# Patient Record
Sex: Female | Born: 2000 | Hispanic: Yes | Marital: Single | Smoking: Never smoker
Health system: Southern US, Community
[De-identification: ages and names within clinical notes are randomized; demographics above are authoritative.]

## PROBLEM LIST (undated history)

## (undated) HISTORY — PX: NO PAST SURGERIES: SHX2092

---
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
## 2022-01-15 DIAGNOSIS — Z419 Encounter for procedure for purposes other than remedying health state, unspecified: Secondary | ICD-10-CM | POA: Diagnosis not present

## 2022-02-15 DIAGNOSIS — Z419 Encounter for procedure for purposes other than remedying health state, unspecified: Secondary | ICD-10-CM | POA: Diagnosis not present

## 2022-03-17 DIAGNOSIS — Z419 Encounter for procedure for purposes other than remedying health state, unspecified: Secondary | ICD-10-CM | POA: Diagnosis not present

## 2022-04-17 DIAGNOSIS — Z419 Encounter for procedure for purposes other than remedying health state, unspecified: Secondary | ICD-10-CM | POA: Diagnosis not present

## 2022-05-18 DIAGNOSIS — Z419 Encounter for procedure for purposes other than remedying health state, unspecified: Secondary | ICD-10-CM | POA: Diagnosis not present

## 2022-06-17 DIAGNOSIS — Z419 Encounter for procedure for purposes other than remedying health state, unspecified: Secondary | ICD-10-CM | POA: Diagnosis not present

## 2022-07-18 DIAGNOSIS — Z419 Encounter for procedure for purposes other than remedying health state, unspecified: Secondary | ICD-10-CM | POA: Diagnosis not present

## 2022-08-17 DIAGNOSIS — Z419 Encounter for procedure for purposes other than remedying health state, unspecified: Secondary | ICD-10-CM | POA: Diagnosis not present

## 2022-09-17 DIAGNOSIS — Z419 Encounter for procedure for purposes other than remedying health state, unspecified: Secondary | ICD-10-CM | POA: Diagnosis not present

## 2022-09-17 NOTE — L&D Delivery Note (Signed)
OB/GYN Faculty Practice Delivery Note  Kathryn Luna is a 22 y.o. G2P1011 s/p VD at [redacted]w[redacted]d. She was admitted for SOL.   ROM: 0h 37m with clear fluid GBS Status:  Positive/-- (05/31 1021) Maximum Maternal Temperature: 98.35F  Labor Progress: Initial SVE: 7/90/-2. She then progressed to complete.   Delivery Date/Time: 03/22/23  2324 Delivery: Called to room and patient was complete and pushing. Head delivered direct OA. No nuchal cord present. Shoulder and body delivered in usual fashion. Infant with spontaneous cry, placed on mother's abdomen, dried and stimulated. Cord clamped x 2 after 1-minute delay, and cut by FOB. Cord blood drawn. Placenta delivered spontaneously with gentle cord traction. Fundus firm with massage and Pitocin, and methergine. Labia, perineum, vagina, and cervix inspected with 1st degree laceration, repaired in usual fashion with 3-0 monocryl.  Baby Weight: pending  Placenta: 3 vessel, intact. Sent to L&D Complications: None Lacerations: as above EBL: 435 mL Analgesia: Epidural   Infant:  APGAR (1 MIN): 9   APGAR (5 MINS): 9    Myrtie Hawk, DO OB Family Medicine Fellow, [facility] for [facility], [facility] 03/22/2023, 11:49 PM

## 2022-10-08 ENCOUNTER — Ambulatory Visit: Payer: Medicaid Other | Admitting: Student

## 2022-10-09 ENCOUNTER — Ambulatory Visit: Payer: Medicaid Other | Admitting: Student

## 2022-10-16 ENCOUNTER — Ambulatory Visit (INDEPENDENT_AMBULATORY_CARE_PROVIDER_SITE_OTHER): Payer: Medicaid Other | Admitting: Family Medicine

## 2022-10-16 VITALS — BP 98/58 | HR 81 | Wt 148.6 lb

## 2022-10-16 DIAGNOSIS — R011 Cardiac murmur, unspecified: Secondary | ICD-10-CM | POA: Insufficient documentation

## 2022-10-16 DIAGNOSIS — R55 Syncope and collapse: Secondary | ICD-10-CM

## 2022-10-16 HISTORY — DX: Syncope and collapse: R55

## 2022-10-16 HISTORY — DX: Cardiac murmur, unspecified: R01.1

## 2022-10-16 NOTE — Assessment & Plan Note (Addendum)
Recurring problem for several months. Suspect multifactorial etiology including vasovagal response and symptomatic hypoglycemia from skipping breakfast. BP also on low side. Less likely iron deficiency, thyroid abnormality, cardiac arrhythmia. Will check CBC, ferritin, BMP, TSH today. Will also refer to cardiology. May need event monitor and/or echo given frequency of events and murmur heard on exam today (not documented previously).

## 2022-10-16 NOTE — Assessment & Plan Note (Signed)
Heard on exam today. Not documented previously. Suspect benign flow murmur but will check basic labs and refer to cardiology as above.

## 2022-10-16 NOTE — Patient Instructions (Addendum)
It was great to see you!  We will check some basic labs today. If you sign up for MyChart I will send you a message with the results. Otherwise I will send a letter or call if abnormal.  Given the frequency of these episodes and murmur noted on exam today, I have referred you to cardiology. They will call you for an appointment.  Your symptoms may be related to skipping breakfast. When your sugar is low it can cause shakiness, sweating, nausea, dizziness, and passing out. PLEASE eat breakfast every day even if it's just something small. You should also drink more water!  You can read more about "vasovagal episodes" online if you're interested. These are common, not usually dangerous, and sometimes occur after standing for prolonged periods. Try not to lock your knees.  Take care, Dr Rock Nephew   Near-Syncope Near-syncope is when you suddenly feel like you might pass out or faint. This may also be called presyncope. During an episode of near-syncope, you may: Feel dizzy, weak, or light-headed. It may feel like the room is spinning. Feel like you may vomit (nauseous). See spots or see all white or all black. Have cold, clammy skin. Feel warm and sweaty. Hear ringing in your ears. This condition is caused by a sudden decrease in blood flow to the brain. This can result from many causes, but most of those causes are not dangerous. However, near-syncope may be a sign of a serious medical problem, so it is important to seek medical care. Follow these instructions at home: Medicines Take over-the-counter and prescription medicines only as told by your doctor. If you are taking blood pressure or heart medicine, get up slowly and spend many minutes getting ready to sit and then stand. This can help with dizziness. Lifestyle Do not drive, use machinery, or play sports until your doctor says it is okay. Do not drink alcohol. Do not smoke or use any products that contain nicotine or tobacco. If you need  help quitting, ask your doctor. Avoid hot tubs and saunas. General instructions Be aware of any changes in your symptoms. Talk with your doctor about your symptoms. You may need to have testing to help find the cause. If you start to feel like you might pass out, sit or lie down right away. If sitting, lower your head down between your legs. If lying down, raise (elevate) your feet above the level of your heart. Breathe deeply and steadily. Wait until all of the symptoms are gone. Have someone stay with you until you feel better. Drink enough fluid to keep your pee (urine) pale yellow. Avoid standing for a long time. If you must stand for a long time, do movements such as: Moving your legs. Crossing your legs. Flexing and stretching your leg muscles. Squatting. Keep all follow-up visits. Contact a doctor if: You continue to have episodes of near fainting. Get help right away if: You pass out or faint. You have any of these symptoms: Fast or uneven heartbeats (palpitations). Pain in your chest, belly, or back. Shortness of breath. You have a seizure. You have a very bad headache. You are confused. You have trouble seeing. You are very weak. You have trouble walking. You are bleeding from your mouth or butt. You have black or tarry poop (stool). These symptoms may be an emergency. Get help right away. Call your local emergency services (911 in the [place] Do not wait to see if the symptoms will go away. Do not drive yourself  to the hospital. Summary Near-syncope is when you suddenly feel like you might pass out or faint. This condition is caused by a sudden decrease in blood flow to the brain. Near-syncope may be a sign of a serious medical problem, so it is important to seek medical care. If you start to feel like you might pass out, sit or lie down right away. If sitting, lower your head down between your legs. If lying down, raise (elevate) your feet above the level of your  heart. Talk with your doctor about your symptoms. You may need to have testing to help find the cause. This information is not intended to replace advice given to you by your health care provider. Make sure you discuss any questions you have with your health care provider. Document Revised: 01/12/2021 Document Reviewed: 01/12/2021 Elsevier Patient Education  [place].

## 2022-10-16 NOTE — Progress Notes (Signed)
    SUBJECTIVE:   CHIEF COMPLAINT / HPI:   Pre-syncope -occurs ~3-4 times per month, not on consecutive days -was seen for the same back in 2022 -can tell when it's going to happen -states her lips get cold, then her face gets really hot, vision goes dark -sits down, resolves after 30 seconds or a minute -seems to occur more when she doesn't eat breakfast and when she's standing for long periods -skips breakfast frequently -no chest pain or palpitations -not on any medications -LMP 10/13/22, lasts ~3 days, not heavy -drinks 3 bottles of water per day -marijuana twice weekly, no correlation with her symptoms   PERTINENT  PMH / PSH: none  OBJECTIVE:   BP (!) 98/58   Pulse 81   Wt 148 lb 9.6 oz (67.4 kg)   LMP 10/13/2022   SpO2 99%   BMI 28.08 kg/m   Gen: NAD, pleasant, able to participate in exam HEENT: [place]/AT, PERRLA, nares patent bilaterally, TM normal bilaterally, oropharynx unremarkable Neck: supple, no cervical or supraclavicular lymphadenopathy, thyroid smooth and normal in size CV: RRR, normal F1/Q1, II/VI systolic ejection murmur heard best at RUSB Resp: Normal effort, lungs CTAB GI: abdomen soft, non-tender, non-distended Extremities: no edema or cyanosis Skin: warm and dry, no rashes noted Neuro: alert, CN II-XII intact, 5/5 strength in all extremities, sensation intact throughout, normal gait Psych: Normal affect and mood  ASSESSMENT/PLAN:   Near syncope Recurring problem for several months. Suspect multifactorial etiology including vasovagal response and symptomatic hypoglycemia from skipping breakfast. BP also on low side. Less likely iron deficiency, thyroid abnormality, cardiac arrhythmia. Will check CBC, ferritin, BMP, TSH today. Will also refer to cardiology. May need event monitor and/or echo given frequency of events and murmur heard on exam today (not documented previously).  Murmur Heard on exam today. Not documented previously. Suspect benign flow  murmur but will check basic labs and refer to cardiology as above.     Alcus Dad, MD [place]

## 2022-10-17 LAB — BASIC METABOLIC PANEL
BUN/Creatinine Ratio: 13 (ref 9–23)
BUN: 5 mg/dL — ABNORMAL LOW (ref 6–20)
CO2: 18 mmol/L — ABNORMAL LOW (ref 20–29)
Calcium: 8.8 mg/dL (ref 8.7–10.2)
Chloride: 102 mmol/L (ref 96–106)
Creatinine, Ser: 0.39 mg/dL — ABNORMAL LOW (ref 0.57–1.00)
Glucose: 81 mg/dL (ref 70–99)
Potassium: 4.1 mmol/L (ref 3.5–5.2)
Sodium: 136 mmol/L (ref 134–144)
eGFR: 147 mL/min/{1.73_m2} (ref >59)

## 2022-10-17 LAB — CBC
Hematocrit: 31.5 % — ABNORMAL LOW (ref 34.0–46.6)
Hemoglobin: 11 g/dL — ABNORMAL LOW (ref 11.1–15.9)
MCH: 31 pg (ref 26.6–33.0)
MCHC: 34.9 g/dL (ref 31.5–35.7)
MCV: 89 fL (ref 79–97)
Platelets: 239 10*3/uL (ref 150–450)
RBC: 3.55 x10E6/uL — ABNORMAL LOW (ref 3.77–5.28)
RDW: 13.2 % (ref 11.7–15.4)
WBC: 5.9 10*3/uL (ref 3.4–10.8)

## 2022-10-17 LAB — FERRITIN: Ferritin: 14 ng/mL — ABNORMAL LOW (ref 15–77)

## 2022-10-17 LAB — TSH: TSH: 0.502 u[IU]/mL (ref 0.450–4.500)

## 2022-10-18 DIAGNOSIS — Z419 Encounter for procedure for purposes other than remedying health state, unspecified: Secondary | ICD-10-CM | POA: Diagnosis not present

## 2022-10-22 ENCOUNTER — Encounter: Payer: Self-pay | Admitting: Family Medicine

## 2022-10-22 ENCOUNTER — Telehealth: Payer: Self-pay | Admitting: Family Medicine

## 2022-10-22 MED ORDER — FERROUS SULFATE 325 (65 FE) MG PO TABS: 325.0000 mg | ORAL_TABLET | ORAL | 0 refills | Status: DC

## 2022-10-22 NOTE — Telephone Encounter (Signed)
Attempted to reach patient again to discuss labs. No answer. Left HIPAA compliant voicemail.  If she returns call, please inform her that her iron levels are low. I recommend she take PO iron supplement every other day. Rx has been sent to her pharmacy.   Alcus Dad, MD PGY-3, [place]

## 2022-10-22 NOTE — Addendum Note (Signed)
Addended by: Alcus Dad on: 10/22/2022 07:36 PM   Modules accepted: Orders

## 2022-10-22 NOTE — Telephone Encounter (Signed)
Attempted to call patient to discuss results from recent visit. Labs show mild anemia and low ferritin consistent with iron deficiency. No answer.  Will try to reach her again this evening.   Alcus Dad, MD PGY-3, Kathryn Luna

## 2022-10-23 NOTE — Telephone Encounter (Signed)
Patient returns call to nurse.   Results discussed with patient and the need to start an iron supplement.   Patient advised this was sent to her pharmacy already. We discussed directions.  All questions answered.

## 2022-11-16 DIAGNOSIS — Z419 Encounter for procedure for purposes other than remedying health state, unspecified: Secondary | ICD-10-CM | POA: Diagnosis not present

## 2022-12-10 ENCOUNTER — Encounter: Payer: Self-pay | Admitting: Family Medicine

## 2022-12-10 ENCOUNTER — Ambulatory Visit (INDEPENDENT_AMBULATORY_CARE_PROVIDER_SITE_OTHER): Payer: Medicaid Other | Admitting: Family Medicine

## 2022-12-10 ENCOUNTER — Other Ambulatory Visit: Payer: Self-pay | Admitting: Family Medicine

## 2022-12-10 VITALS — BP 110/66 | HR 74 | Ht 62.0 in | Wt 153.0 lb

## 2022-12-10 DIAGNOSIS — Z3201 Encounter for pregnancy test, result positive: Secondary | ICD-10-CM | POA: Diagnosis not present

## 2022-12-10 LAB — POCT URINE PREGNANCY: Preg Test, Ur: POSITIVE — AB

## 2022-12-10 NOTE — Patient Instructions (Addendum)
It was nice seeing you today!  Please go ahead and start a prenatal vitamin.  We will be giving you a call about your first prenatal appointment.  Stay well, Kathryn Button, MD [place] [phone]  --  Make sure to check out at the front desk before you leave today.  Please arrive at least 15 minutes prior to your scheduled appointments.  If you had blood work today, I will send you a MyChart message or a letter if results are normal. Otherwise, I will give you a call.  If you had a referral placed, they will call you to set up an appointment. Please give [place] a call if you don't hear back in the next 2 weeks.  If you need additional refills before your next appointment, please call your pharmacy first.

## 2022-12-10 NOTE — Progress Notes (Signed)
    SUBJECTIVE:   CHIEF COMPLAINT / HPI:  Chief Complaint  Patient presents with   Possible Pregnancy         Patient here for possible pregnancy.  Home pregnancy test  possible 1 week ago. Pregnancy was not planned.  She has been in shock but planning to keep pregnancy.  Would like prenatal care at [facility].  Her partner is also excited. She is worried because she has not told her mother about pregnancy yet.  Planning to tell her sometime this week.  PERTINENT  PMH / PSH:   Patient Care Team: Ezequiel Essex, MD as PCP - General (Family Medicine)   OBJECTIVE:   BP 110/66   Pulse 74   Ht 5\' 2"  (1.575 m)   Wt 153 lb (69.4 kg)   LMP  (LMP Unknown)   Breastfeeding No   BMI 27.98 kg/m   Physical Exam Constitutional:      General: She is not in acute distress. Cardiovascular:     Rate and Rhythm: Normal rate.  Pulmonary:     Effort: Pulmonary effort is normal. No respiratory distress.  Musculoskeletal:     Cervical back: Neck supple.  Neurological:     Mental Status: She is alert.         12/10/2022    9:39 AM  Depression screen PHQ 2/9  Decreased Interest 0  Down, Depressed, Hopeless 0  PHQ - 2 Score 0  Altered sleeping 0  Tired, decreased energy 0  Change in appetite 0  Feeling bad or failure about yourself  0  Trouble concentrating 0  Moving slowly or fidgety/restless 0  Suicidal thoughts 0  PHQ-9 Score 0     {Show previous vital signs (optional):23777}    ASSESSMENT/PLAN:   1. Pregnancy test positive Urine pregnancy test in office confirms home positive pregnancy test.  Obtain prenatal labs today. - POCT urine pregnancy - CBC/D/Plt+RPR+Rh+ABO+RubIgG... - advised to start prenatal vitamin - message sent to admin staff to schedule initial prenatal visit    Zola Button, MD [place]

## 2022-12-12 LAB — CBC/D/PLT+RPR+RH+ABO+RUBIGG...
Antibody Screen: NEGATIVE
Basophils Absolute: 0 10*3/uL (ref 0.0–0.2)
Basos: 1 %
Bilirubin, UA: NEGATIVE
EOS (ABSOLUTE): 0.1 10*3/uL (ref 0.0–0.4)
Eos: 1 %
Glucose, UA: NEGATIVE
HCV Ab: NONREACTIVE
HIV Screen 4th Generation wRfx: NONREACTIVE
Hematocrit: 32.2 % — ABNORMAL LOW (ref 34.0–46.6)
Hemoglobin: 10.6 g/dL — ABNORMAL LOW (ref 11.1–15.9)
Hepatitis B Surface Ag: NEGATIVE
Immature Grans (Abs): 0 10*3/uL (ref 0.0–0.1)
Immature Granulocytes: 0 %
Lymphocytes Absolute: 0.9 10*3/uL (ref 0.7–3.1)
Lymphs: 13 %
MCH: 30.5 pg (ref 26.6–33.0)
MCHC: 32.9 g/dL (ref 31.5–35.7)
MCV: 93 fL (ref 79–97)
Monocytes Absolute: 0.4 10*3/uL (ref 0.1–0.9)
Monocytes: 6 %
Neutrophils Absolute: 5.1 10*3/uL (ref 1.4–7.0)
Neutrophils: 79 %
Nitrite, UA: NEGATIVE
Platelets: 268 10*3/uL (ref 150–450)
RBC, UA: NEGATIVE
RBC: 3.48 x10E6/uL — ABNORMAL LOW (ref 3.77–5.28)
RDW: 13.5 % (ref 11.7–15.4)
RPR Ser Ql: NONREACTIVE
Rh Factor: POSITIVE
Rubella Antibodies, IGG: 1.54 index (ref >0.99)
Specific Gravity, UA: 1.017 (ref 1.005–1.030)
Urobilinogen, Ur: 1 mg/dL (ref 0.2–1.0)
WBC: 6.4 10*3/uL (ref 3.4–10.8)
pH, UA: 7 (ref 5.0–7.5)

## 2022-12-12 LAB — HCV INTERPRETATION

## 2022-12-12 LAB — MICROSCOPIC EXAMINATION
Casts: NONE SEEN /lpf
Epithelial Cells (non renal): >10 /hpf — AB (ref 0–10)
RBC, Urine: NONE SEEN /hpf (ref 0–2)

## 2022-12-12 LAB — URINE CULTURE, OB REFLEX

## 2022-12-17 ENCOUNTER — Ambulatory Visit: Payer: Medicaid Other | Admitting: Family Medicine

## 2022-12-17 DIAGNOSIS — Z419 Encounter for procedure for purposes other than remedying health state, unspecified: Secondary | ICD-10-CM | POA: Diagnosis not present

## 2022-12-21 ENCOUNTER — Ambulatory Visit (INDEPENDENT_AMBULATORY_CARE_PROVIDER_SITE_OTHER): Payer: Medicaid Other | Admitting: Family Medicine

## 2022-12-21 ENCOUNTER — Other Ambulatory Visit (HOSPITAL_COMMUNITY)
Admission: RE | Admit: 2022-12-21 | Discharge: 2022-12-21 | Disposition: A | Discharge status: Home / Self Care | Payer: Medicaid Other | Source: Ambulatory Visit | Attending: Family Medicine | Admitting: Family Medicine

## 2022-12-21 VITALS — BP 106/60 | HR 86 | Wt 158.4 lb

## 2022-12-21 DIAGNOSIS — Z3490 Encounter for supervision of normal pregnancy, unspecified, unspecified trimester: Secondary | ICD-10-CM | POA: Insufficient documentation

## 2022-12-21 DIAGNOSIS — Z789 Other specified health status: Secondary | ICD-10-CM

## 2022-12-21 MED ORDER — FERROUS SULFATE 325 (65 FE) MG PO TABS: 325.0000 mg | ORAL_TABLET | ORAL | 3 refills | Status: DC

## 2022-12-21 MED ORDER — PRENATAL 27-0.8 MG PO TABS: 1.0000 | ORAL_TABLET | Freq: Every day | ORAL | 3 refills | Status: DC

## 2022-12-21 NOTE — Patient Instructions (Signed)
It was wonderful to see you today. Thank you for allowing me to be a part of your care. Below is a short summary of what we discussed at your visit today:  Pregnancy Dating ultrasound is next week. We will see how far along you are. See next page for appointment details.   Start prenatal vitamin EVERY day.  Keep taking iron every other day.  After 12 weeks of pregnacy, you can start a baby aspirin (81 mg) every day to help reduce chances of pre-eclampsia and eclampsia.   Blood work today to check you don't have a hemoglobin disorder like sickle cell disease or trait.   Once we know you are more than [redacted] weeks along, we can get the genetic testing for both yourself (Horizon test) and baby Runner, broadcasting/film/video test). These are both covered by insurance.   You will do an early diabetes screening test at your convenience. Let's have you do it after your ultrasound so we know how far along you are.   Prenatal Classes Go to OnSiteLending.nl for more information on the pregnancy and child birth classes that [facility] has to offer.   Resource regarding exposures that could affect pregnancy: Mother To Pecola Leisure is a Dentist with lots of information on the effects of many medications and exposures on your pregnancy. It is free to use, including their web site, phone line, text service, app, or email and live chat.  http://golden-thomas.org/ Email or live chat: MotherToBaby.org Phone: (340)313-2584 Text: 3476365474 App: search "LactRx"   Emergency Planning If you experience any vaginal bleeding, leakage of fluids, don't feel your baby moving as much, or start to have contractions less than 5 minutes apart please go directly to the Maternal Assessment Unit at [facility] for evaluation.  Maternity and women's care services located on the [place] side of The [place]. [facility] (Entrance C off [address]  [address] [place],  [place]  [address]     Please bring all of your medications to every appointment!  If you have any questions or concerns, please do not hesitate to contact us via phone or MyChart message.   Fayette Pho, MD

## 2022-12-21 NOTE — Progress Notes (Signed)
Patient Name: Kathryn Luna Date of Birth: [birth year] [name] Initial Prenatal Visit  Kathryn Luna is a 22 y.o. year old G2P0010 at Unknown GA who presents for her initial prenatal visit. Pregnancy is not planned She reports fatigue and positive home pregnancy test positive 2-3 weeks ago.  She is not taking a prenatal vitamin.  Is taking iron pills for known anemia. She denies pelvic pain or vaginal bleeding.   Pregnancy Dating: The patient is dated by unknown.  LMP: unknown, possibly January Normal periods with spotting  Period is certain:  No.  Periods were regular:  No.  LMP was a typical period:  n/a Using hormonal contraception in 3 months prior to conception: No  Lab Review: Blood type: A Rh Status: + Antibody screen: Negative HIV: Negative RPR: Negative Hemoglobin electrophoresis reviewed: No - not performed. Will collect today.  Results of OB urine culture are: Negative Rubella: Immune Hep C Ab: Negative Varicella status is Unknown - vaccination status not in [facility], although patient grew up here in [place] and is young enough to have varicella vaccine as part of routine childhood vaccines.   PMH: Reviewed and as detailed below: HTN: No  Gestational Hypertension/preeclampsia: No  Type 1 or 2 Diabetes: No  Depression:  No  Seizure disorder:  No VTE: No ,  History of STI: Yes chlamydia previously 08/31/2021 Abnormal Pap smear:  No Genital herpes simplex:  No   PSH: Gynecologic Surgery:  no Surgical history reviewed, notable for: None  Obstetric History: Obstetric history tab updated and reviewed.  Summary of prior pregnancies: 1 Elective abortion 2023 Cesarean delivery: No  Gestational Diabetes:  No Hypertension in pregnancy: No History of preterm birth: No History of LGA/SGA infant:  No History of shoulder dystocia: No Indications for referral were reviewed, and the patient has no obstetric indications for referral to  High Risk OB Clinic at this time.   Social History: Partner's name: Ephriam Knuckles  Tobacco use: Yes - up until one month ago (marijuana) but none since positive home pregnancy test Alcohol use:  No Other substance use:  No  Current Medications:  Iron supplement - every other day  Reviewed and appropriate in pregnancy.   Genetic and Infection Screen: Flow Sheet Updated Yes  Prenatal Exam: Gen: Well nourished, well developed.  No distress.  Vitals noted. HEENT: Normocephalic, atraumatic.   CV: RRR no murmur, gallops or rubs Lungs: CTA B.  Normal respiratory effort without wheezes or rales. Abd: soft, NTND. +BS.  Uterus not appreciated above pelvis. GU: Normal external female genitalia without lesions.  Nl vaginal, well rugated without lesions. No vaginal discharge.  Bimanual exam: No adnexal mass or TTP. No CMT.  Uterus size appears to be 2 inches above the umbilicus - precepted with Dr. Deirdre Priest, who agreed abdomen appears to have mass above umbilicus. Not as firm as expected for gravid uterus.  Natal: FHR detected easily above pubic symphysis, 140s Ext: No clubbing, cyanosis or edema. Psych: Normal grooming and dress.  Not depressed or anxious appearing.  Normal thought content and process without flight of ideas or looseness of associations  Fetal heart tones: Appropriate - 140s  Assessment/Plan:  Kathryn Luna is a 22 y.o. G2P0010 at Unknown who presents to initiate prenatal care. She is doing well.   Current pregnancy issues include history of chlamydia, unclear if appropriately treated.  Routine prenatal care: As dating is not reliable, a dating ultrasound has been ordered. Dating tab updated. Pre-pregnancy weight updated. Expected weight gain  this pregnancy is 25-35 pounds  Prenatal labs reviewed, notable for anemia. Indications for referral to HROB were reviewed and the patient does not meet criteria for referral.  Medication list reviewed and updated.   Recommended patient see a dentist for regular care.  Bleeding and pain precautions reviewed. Importance of prenatal vitamins reviewed.  Genetic screening offered. Patient opted for: Panorama and Horizon screenings The patient has the following indications for aspirinto begin 81 mg at 12-16 weeks: One high risk condition: no single high risk condition  MORE than one moderate risk condition: nulliparity and low SES   Aspirin was  recommended today based upon above risk factors (one high risk condition or more than one moderate risk factor)  The patient will not be age 20 or over at time of delivery. Referral to genetic counseling was not offered today.  The patient has the following risk factors for preexisting diabetes: BMI > 25 and high risk ethnicity (Latino, PhilippinesAfrican American, Native American, MalawiPacific Islander, Asian Naval architectAmerican) . An early 1 hour glucose tolerance test was ordered. Pregnancy Medical Home and PHQ-9 forms completed, problems noted: No  2. Pregnancy issues include the following which were addressed today:   Prenatal labs: Majority of new OB labs collected previously. Just missing the hemoglobin electrophoresis. Will draw today.  Unclear gestational age and LMP: abdominal mass palpable up to 2 inches above umbilicus, albeit softer than expected for gravid uterus. Patient denies current constipation. Fetal heart tones heard on Doppler, so at least ~[redacted] weeks along.  Dating US scheduled for next week Genetic testing desired: Panorama and Horizon Will schedule once we know patient is > [redacted] weeks gestation Unknown varicella and Hep B: [place]. Patient is young enough and has only lived in [place], so should have been vaccinated.  Varicella titers at next blood draw Early diabetes screen: Ordered early 1 hr GTT for screening. Patient to obtain lab only appointment. STI screen: History of (+) chlamydia screen 08/31/2021, chart notes unclear as to whether she completed PO treatment.   Collected routine vaginal swab and PAP smear today. Will follow results and treat as indicated.  Start prenatal vitamins Continue iron supplement every other day Complete pregnancy medical home form and PHQ-9 at next appointment.   Next f/u 1 month - although if dating US shows later gestational age, will call to re-adjust.   Fayette Phoatherine Paxtyn Wisdom, MD

## 2022-12-24 LAB — CYTOLOGY - PAP
Chlamydia: NEGATIVE
Comment: NEGATIVE
Comment: NEGATIVE
Comment: NORMAL
Diagnosis: NEGATIVE
Neisseria Gonorrhea: NEGATIVE
Trichomonas: NEGATIVE

## 2022-12-26 ENCOUNTER — Ambulatory Visit (HOSPITAL_COMMUNITY)
Admission: RE | Admit: 2022-12-26 | Discharge: 2022-12-26 | Disposition: A | Discharge status: Home / Self Care | Payer: Medicaid Other | Source: Ambulatory Visit | Attending: Family Medicine | Admitting: Family Medicine

## 2022-12-26 ENCOUNTER — Other Ambulatory Visit: Payer: Self-pay | Admitting: Family Medicine

## 2022-12-26 DIAGNOSIS — Z3A29 29 weeks gestation of pregnancy: Secondary | ICD-10-CM | POA: Diagnosis not present

## 2022-12-26 DIAGNOSIS — Z789 Other specified health status: Secondary | ICD-10-CM

## 2022-12-26 DIAGNOSIS — Z3490 Encounter for supervision of normal pregnancy, unspecified, unspecified trimester: Secondary | ICD-10-CM | POA: Diagnosis not present

## 2022-12-26 DIAGNOSIS — Z3689 Encounter for other specified antenatal screening: Secondary | ICD-10-CM | POA: Diagnosis not present

## 2022-12-26 LAB — HGB FRACTIONATION CASCADE
Hgb A2: 2.3 % (ref 1.8–3.2)
Hgb A: 97.7 % (ref 96.4–98.8)
Hgb F: 0 % (ref 0.0–2.0)
Hgb S: 0 %

## 2022-12-28 ENCOUNTER — Telehealth: Payer: Self-pay

## 2022-12-28 DIAGNOSIS — Z3490 Encounter for supervision of normal pregnancy, unspecified, unspecified trimester: Secondary | ICD-10-CM | POA: Insufficient documentation

## 2022-12-28 DIAGNOSIS — Z3A29 29 weeks gestation of pregnancy: Secondary | ICD-10-CM

## 2022-12-28 MED ORDER — ASPIRIN 81 MG PO TBEC
81.00 mg | DELAYED_RELEASE_TABLET | Freq: Every day | ORAL | 1 refills | Status: DC
Start: 2022-12-28 — End: 2023-03-24

## 2022-12-28 NOTE — Telephone Encounter (Signed)
Called patient twice, no answer, left brief voicemail.  Next called mother to verify her phone number, mom reports she is at work currently and has her phone on silent.  It sounds like mom was there at the ultrasound with the patient and asks about results, however I did not disclose this as I do not have written consent by patient at this time.  Called patient again, now that I have double checked her phone number with her mother, left a more detailed voicemail that included how far along she was, her due date, and that she is to call back for appointments.  As of dating ultrasound on 4/10, was 29 weeks 2 days gestation. Her due date is March 11, 2023.   I have booked her the following appointments to keep her on schedule: - 4/26 at 10:30 am, with myself - 5/09 at 10:30 am with [facility] faculty clinic - 5/24 at 1:30 pm, with myself  Action items:  If schedule conflict with above appointments, should be rescheduled as close as possible Needs lab only appointment ASAP for 1 hour glucose tolerance test, varicella titers, and genetic screening with Horizon and panorama Anatomy ultrasound - I have CCed [facility] team to schedule this and call patient with date and time Patient to start baby aspirin, prescription sent to pharmacy  Fayette Pho, MD

## 2022-12-28 NOTE — Telephone Encounter (Signed)
Patient calls nurse line requesting results of recent US.  She reports the tech would not share how far a long she is.   Will forward to PCP.

## 2022-12-31 ENCOUNTER — Telehealth: Payer: Self-pay | Admitting: Family Medicine

## 2022-12-31 ENCOUNTER — Telehealth: Payer: Self-pay

## 2022-12-31 NOTE — Telephone Encounter (Signed)
Called patient to discuss results. She got the voicemail from Friday with her gestational age and due date. All questions answered. Patient already scheduled for lab only appointment tomorrow, 4/16. Gave her the next prenatal appointment day and time.   MFM OB US for anatomy not yet scheduled. Told patient will be done soon and RN will call her with details.   Fayette Pho, MD

## 2022-12-31 NOTE — Telephone Encounter (Signed)
Called patient with upcoming appointments and there was no answer.  Left voicemail for patient to call office to receive information.  01/11/2023 @ 1030 [facility] 01/24/2023 @ 1030 [facility] 01/31/2023 Ultrasound @ 1345[name] Luna M[facility] 02/08/2023 @ 1330 [facility]  .Glennie Hawk, CMA

## 2023-01-01 ENCOUNTER — Other Ambulatory Visit: Payer: Medicaid Other

## 2023-01-01 DIAGNOSIS — Z789 Other specified health status: Secondary | ICD-10-CM

## 2023-01-01 DIAGNOSIS — Z3490 Encounter for supervision of normal pregnancy, unspecified, unspecified trimester: Secondary | ICD-10-CM

## 2023-01-01 DIAGNOSIS — Z3A29 29 weeks gestation of pregnancy: Secondary | ICD-10-CM | POA: Diagnosis not present

## 2023-01-01 DIAGNOSIS — Z3493 Encounter for supervision of normal pregnancy, unspecified, third trimester: Secondary | ICD-10-CM | POA: Diagnosis not present

## 2023-01-02 LAB — VARICELLA ZOSTER ABS, IGG/IGM
Varicella IgM: <0.91 index (ref 0.00–0.90)
Varicella zoster IgG: 321 index (ref >165)

## 2023-01-02 LAB — GLUCOSE TOLERANCE, 1 HOUR: Glucose, 1Hr PP: 78 mg/dL (ref 70–199)

## 2023-01-07 ENCOUNTER — Encounter: Payer: Self-pay | Admitting: Family Medicine

## 2023-01-07 DIAGNOSIS — O0930 Supervision of pregnancy with insufficient antenatal care, unspecified trimester: Secondary | ICD-10-CM | POA: Insufficient documentation

## 2023-01-08 ENCOUNTER — Telehealth: Payer: Self-pay

## 2023-01-08 NOTE — Telephone Encounter (Signed)
Patient calls nurse line regarding genetic test results. She states that she was told that results would come in within a week.   She tried to use a QR code, however, this is not working. She also tried to reach out directly to lab. She reports that she was told she would not be able to get results until 5/18.  Forwarding to PCP.   Veronda Prude, RN

## 2023-01-09 ENCOUNTER — Telehealth: Payer: Self-pay | Admitting: Family Medicine

## 2023-01-09 DIAGNOSIS — Z3A31 31 weeks gestation of pregnancy: Secondary | ICD-10-CM

## 2023-01-09 DIAGNOSIS — Z30017 Encounter for initial prescription of implantable subdermal contraceptive: Secondary | ICD-10-CM

## 2023-01-09 DIAGNOSIS — Z1371 Encounter for nonprocreative screening for genetic disease carrier status: Secondary | ICD-10-CM

## 2023-01-09 HISTORY — DX: Encounter for initial prescription of implantable subdermal contraceptive: Z30.017

## 2023-01-09 HISTORY — DX: Encounter for nonprocreative screening for genetic disease carrier status: Z13.71

## 2023-01-09 LAB — HORIZON 4 (SMA, CF, FRAGILE X, DMD)
CYSTIC FIBROSIS: NEGATIVE
DUCHENNE/BECKER MUSCULAR DYSTROPHY: NEGATIVE
FRAGILE X SYNDROME: NEGATIVE
REPORT SUMMARY: NEGATIVE
SPINAL MUSCULAR ATROPHY: NEGATIVE

## 2023-01-09 NOTE — Telephone Encounter (Signed)
Received results from Horizon screening for mom's genetic carrier status. No answer, left VM. Horizon test was negative for all 4 genetic diseases screened: cystic fibrosis, spinal muscular atrophy, Duchenne Muscular Dystrophy, and Fragile X syndrome.   Will call back once we have the Panorama results for baby.   Fayette Pho, MD

## 2023-01-09 NOTE — Assessment & Plan Note (Signed)
Called mom with results. Left VM. See phone note.

## 2023-01-10 LAB — PANORAMA PRENATAL TEST FULL PANEL:PANORAMA TEST PLUS 5 ADDITIONAL MICRODELETIONS: FETAL FRACTION: 12.7

## 2023-01-11 ENCOUNTER — Ambulatory Visit (INDEPENDENT_AMBULATORY_CARE_PROVIDER_SITE_OTHER): Payer: Medicaid Other | Admitting: Family Medicine

## 2023-01-11 ENCOUNTER — Encounter: Payer: Self-pay | Admitting: Family Medicine

## 2023-01-11 VITALS — BP 110/62 | HR 84 | Wt 161.0 lb

## 2023-01-11 DIAGNOSIS — Z23 Encounter for immunization: Secondary | ICD-10-CM

## 2023-01-11 DIAGNOSIS — Z3A31 31 weeks gestation of pregnancy: Secondary | ICD-10-CM | POA: Diagnosis not present

## 2023-01-11 DIAGNOSIS — Z789 Other specified health status: Secondary | ICD-10-CM | POA: Diagnosis not present

## 2023-01-11 NOTE — Patient Instructions (Signed)
It was wonderful to see you today. Thank you for allowing me to be a part of your care. Below is a short summary of what we discussed at your visit today:  Pregnancy TDaP vaccine today to protect the baby from pertussis.  We are currently out of the COVID booster. You can get it at your next appointment or at any pharmacy.   RSV vaccine: For pregnant ladies, this vaccine is called RSVpreF Verdis Frederickson).  We do not have it here in clinic.  I would call the health department to see if you can get it there.   Prenatal Classes Go to OnSiteLending.nl for more information on the pregnancy and child birth classes that [facility] has to offer.   Resource regarding exposures that could affect pregnancy: Mother To Pecola Leisure is a Dentist with lots of information on the effects of many medications and exposures on your pregnancy. It is free to use, including their web site, phone line, text service, app, or email and live chat.  http://golden-thomas.org/ Email or live chat: MotherToBaby.org Phone: (404)326-4994 Text: 480-242-4806 App: search "LactRx"   Emergency Planning If you experience any vaginal bleeding, leakage of fluids, don't feel your baby moving as much, or start to have contractions less than 5 minutes apart please go directly to the Maternal Assessment Unit at [facility] for evaluation.  Maternity and women's care services located on the [place] side of The [place]. [facility] (Entrance C off [address]  [address] [place],  [place]  [address]     Please bring all of your medications to every appointment!  If you have any questions or concerns, please do not hesitate to contact us via phone or MyChart message.   Fayette Pho, MD

## 2023-01-11 NOTE — Progress Notes (Signed)
  [facility] Prenatal Visit  Ieesha Abbasi is a 22 y.o. G2P0010 at [redacted]w[redacted]d here for routine follow up. She is dated by midtrimester ultrasound.  She reports no complaints.  She reports fetal movement. She denies vaginal bleeding, contractions, or loss of fluid.  See flow sheet for details.  Vitals:   01/11/23 1045  BP: 110/62  Pulse: 84   A/P: Pregnancy at [redacted]w[redacted]d.  Doing well.   Routine prenatal care:  Dating reviewed, dating tab is correct Fetal heart tones: Appropriate 150s Fundal height: within expected range.  30-31 cm The patient does not have a history of HSV and valacyclovir is not indicated at this time.  The patient does not have a history of Cesarean delivery and no referral to [facility] is indicated Infant feeding choice: Both  Contraception choice: Nexplanon  Infant circumcision desired no if boy - gender is surprise to mom Influenza vaccine not administered as not influenza season.   Tdap was given today. COVID vaccination was discussed, patient amenable, not available today.   Childbirth and education classes were offered. See AVS.  Preterm labor and fetal movement precautions reviewed.  2. Pregnancy issues include the following and were addressed as appropriate today: Problem list and pregnancy box updated: Yes.  Anatomy US: Scheduled for 01/31/23 Unknown Hep B: Not in [facility]. Patient is young enough and has only lived in [place], so should have been vaccinated. Will obtain Hep. B titers today.  Continue prenatal vitamins Continue iron supplement every other day Complete pregnancy medical home form and PHQ-9 at next appointment.   Scheduled for Ob Faculty clinic in third trimester on 5/09.   Follow up 2 weeks.   Fayette Pho, MD

## 2023-01-11 NOTE — Assessment & Plan Note (Signed)
TDaP today. Recommended COVID booster and RSV vaccine at health department or pharmacy.

## 2023-01-12 LAB — CBC
Hematocrit: 30.3 % — ABNORMAL LOW (ref 34.0–46.6)
Hemoglobin: 10.1 g/dL — ABNORMAL LOW (ref 11.1–15.9)
MCH: 30.8 pg (ref 26.6–33.0)
MCHC: 33.3 g/dL (ref 31.5–35.7)
MCV: 92 fL (ref 79–97)
Platelets: 261 10*3/uL (ref 150–450)
RBC: 3.28 x10E6/uL — ABNORMAL LOW (ref 3.77–5.28)
RDW: 13.7 % (ref 11.7–15.4)
WBC: 6.5 10*3/uL (ref 3.4–10.8)

## 2023-01-12 LAB — HAV, HBV IMMUNITY
Hep B Core Total Ab: NEGATIVE
Hep B Surface Ab, Qual: NONREACTIVE
hep A Total Ab: POSITIVE — AB

## 2023-01-12 LAB — HAVIGM: Hep A IgM: NEGATIVE

## 2023-01-15 ENCOUNTER — Encounter: Payer: Self-pay | Admitting: Family Medicine

## 2023-01-15 DIAGNOSIS — Z0184 Encounter for antibody response examination: Secondary | ICD-10-CM | POA: Insufficient documentation

## 2023-01-15 DIAGNOSIS — Z789 Other specified health status: Secondary | ICD-10-CM | POA: Insufficient documentation

## 2023-01-16 DIAGNOSIS — Z419 Encounter for procedure for purposes other than remedying health state, unspecified: Secondary | ICD-10-CM | POA: Diagnosis not present

## 2023-01-21 ENCOUNTER — Encounter: Payer: Medicaid Other | Admitting: Family Medicine

## 2023-01-22 ENCOUNTER — Encounter: Payer: Self-pay | Admitting: Family Medicine

## 2023-01-24 ENCOUNTER — Other Ambulatory Visit: Payer: Self-pay

## 2023-01-24 ENCOUNTER — Ambulatory Visit (INDEPENDENT_AMBULATORY_CARE_PROVIDER_SITE_OTHER): Payer: Medicaid Other | Admitting: Family Medicine

## 2023-01-24 VITALS — BP 116/65 | HR 93 | Wt 161.4 lb

## 2023-01-24 DIAGNOSIS — Z3490 Encounter for supervision of normal pregnancy, unspecified, unspecified trimester: Secondary | ICD-10-CM | POA: Diagnosis not present

## 2023-01-24 DIAGNOSIS — Z23 Encounter for immunization: Secondary | ICD-10-CM

## 2023-01-24 MED ORDER — FERROUS SULFATE 325 (65 FE) MG PO TABS: 325.0000 mg | ORAL_TABLET | ORAL | 3 refills | Status: DC

## 2023-01-24 NOTE — Patient Instructions (Addendum)
Go to the MAU at [facility] at [facility] if: You have cramping/contractions that do not go away with drinking water Your water breaks.  Sometimes it is a big gush of fluid, sometimes it is just a trickle that keeps getting your underwear wet or running down your legs You have vaginal bleeding.    You do not feel your baby moving like normal.  If you do not, get something to eat and drink and lay down and focus on feeling your baby move. If your baby is still not moving like normal, you should go to MAU.  Please call the ultrasound department at [phone] to reschedule your ultrasound appointment. Please get the ultrasound ASAP.  Be well, Dr. Pollie Meyer

## 2023-01-24 NOTE — Progress Notes (Signed)
  [facility] Prenatal Visit  Kathryn Luna is a 22 y.o. G2P0010 at [redacted]w[redacted]d here for routine follow up. She is dated by midtrimester ultrasound.  She reports no complaints.  She reports fetal movement. She denies vaginal bleeding, contractions, or loss of fluid.  See flow sheet for details.  Vitals:   01/24/23 1037  BP: 116/65  Pulse: 93     A/P: Pregnancy at [redacted]w[redacted]d.  Doing well.   Routine prenatal care:  Dating reviewed, dating tab is correct Fetal heart tones: Appropriate Fundal height: within expected range.  The patient does not have a history of HSV and valacyclovir is not indicated at this time.  The patient does not have a history of Cesarean delivery and no referral to [facility] is indicated Infant feeding choice: Both  Contraception choice: Nexplanon  Infant circumcision desired yes **does not know sex Influenza vaccine not administered as not influenza season.   Tdap was not given today, previously administered COVID vaccination was discussed and received booster today.  Childbirth and education classes were offered. Pregnancy education regarding benefits of breastfeeding, contraception, fetal growth, expected weight gain, and safe infant sleep were discussed.  Preterm labor and fetal movement precautions reviewed.   2. Pregnancy issues include the following and were addressed as appropriate today:  Unclear dating - pt initially scheduled for MFM on 5/16 but needs to reschedule due to travel, provided patient with number to call and reschedule for as soon as possible  Problem list and pregnancy box updated: Yes.    Follow up 2 weeks.

## 2023-01-31 ENCOUNTER — Ambulatory Visit: Payer: Medicaid Other

## 2023-02-08 ENCOUNTER — Encounter: Payer: Self-pay | Admitting: Family Medicine

## 2023-02-15 ENCOUNTER — Ambulatory Visit (INDEPENDENT_AMBULATORY_CARE_PROVIDER_SITE_OTHER): Payer: Medicaid Other | Admitting: Family Medicine

## 2023-02-15 ENCOUNTER — Other Ambulatory Visit (HOSPITAL_COMMUNITY)
Admission: RE | Admit: 2023-02-15 | Discharge: 2023-02-15 | Disposition: A | Discharge status: Home / Self Care | Payer: Medicaid Other | Source: Ambulatory Visit | Attending: Family Medicine | Admitting: Family Medicine

## 2023-02-15 ENCOUNTER — Telehealth: Payer: Self-pay

## 2023-02-15 ENCOUNTER — Encounter: Payer: Self-pay | Admitting: *Deleted

## 2023-02-15 VITALS — BP 104/62 | HR 81 | Wt 166.2 lb

## 2023-02-15 DIAGNOSIS — Z3493 Encounter for supervision of normal pregnancy, unspecified, third trimester: Secondary | ICD-10-CM | POA: Diagnosis not present

## 2023-02-15 DIAGNOSIS — Z3483 Encounter for supervision of other normal pregnancy, third trimester: Secondary | ICD-10-CM | POA: Insufficient documentation

## 2023-02-15 DIAGNOSIS — Z3A36 36 weeks gestation of pregnancy: Secondary | ICD-10-CM | POA: Diagnosis not present

## 2023-02-15 DIAGNOSIS — O365930 Maternal care for other known or suspected poor fetal growth, third trimester, not applicable or unspecified: Secondary | ICD-10-CM

## 2023-02-15 HISTORY — DX: Maternal care for other known or suspected poor fetal growth, third trimester, not applicable or unspecified: O36.5930

## 2023-02-15 NOTE — Progress Notes (Cosign Needed Addendum)
  [facility] Prenatal Visit  Kathryn Luna is a 22 y.o. G2P0010 at [redacted]w[redacted]d here for routine follow up. She is dated by midtrimester ultrasound.  She reports vaginal irritation/inguinal pain. She reports fetal movement. She denies vaginal bleeding, contractions, or loss of fluid. See flow sheet for details.  Vitals:   02/15/23 0833  BP: 104/62  Pulse: 81   Inguinal pain: Some inguinal and labia majora pain Intermittent  Discussed etiology of round ligament pain Infant still moving well, no vag DC or bleeding, no contractions/cramps  A/P: Pregnancy at [redacted]w[redacted]d.  Doing well.   Routine prenatal care  Dating reviewed, dating tab is correct Fetal heart tones Appropriate - 150s Fundal height >2 cm from expected size given dating, discussed with preceptor.  - 33 cm Fetal position confirmed Vertex using Ultrasound .  GBS collected today. .  Repeat GC/CT collected today.  The patient does not have a history of HSV and valacyclovir is not indicated at this time.  Infant feeding choice: Both  Contraception choice: Nexplanon  Infant circumcision desired yes (if female) Influenza vaccine not administered as not influenza season.   Tdap previously administered between 27-36 weeks  @ 01/11/23 COVID booster given 01/24/23 Pregnancy education regarding preterm labor, fetal movement,  benefits of breastfeeding, contraception, fetal growth, expected weight gain, and safe infant sleep were discussed.   2. Pregnancy issues include the following and were addressed as appropriate today:  Routine prenatal care: Continue aspirin, PNV, iron. Tolerating all well.  Size-date discrepancy: Given teen G2P0010 mother, high risk for IUGR. No anatomy scan yet. Will call MFM to attempt to move up [place] sooner.  Late to prenatal care: Initiated care around 7 months pregnant, somewhat cryptic early pregnancy. Adequate follow up and testing since.  Hep B non-immune: Prenatal titers show non-immune.  [facility] indicates 3 doses completed as infant. Plan to complete after delivery.  Problem list and pregnancy box updated: Yes.   Follow up 1 week.    Fayette Pho, MD

## 2023-02-15 NOTE — Patient Instructions (Addendum)
It was wonderful to see you today. Thank you for allowing me to be a part of your care. Below is a short summary of what we discussed at your visit today:  Pregnancy You are getting close! You are 36 weeks and 4 days today.  Due date is estimated as 03/11/23.  Keep taking your prenatal vitamin, iron pill, and aspirin. You're doing a good job! Baby is head down - meaning "locked and loaded" for delivery. :)  As of right now, here are the plans you've told us about: Epidural with delivery for pain control Nexplanon placed in your arm in the hospital before you go home Breast-feeding and bottlefeeding  Ultrasound TODAY at 1:30 pm.  Details about address on the next page.  Arrive at 1:15 pm to get all checked in.   Prenatal Classes Go to OnSiteLending.nl for more information on the pregnancy and child birth classes that  has to offer.   Emergency Planning If you experience any vaginal bleeding, leakage of fluids, don't feel your baby moving as much, or start to have contractions less than 5 minutes apart please go directly to the Maternal Assessment Unit at [facility] for evaluation.  Maternity and women's care services located on the [place] side of The [place]. [facility] (Entrance C off [address]  [address] Entrance C [place],  [place]  [address]     If you have any questions or concerns, please do not hesitate to contact us via phone or MyChart message.   Fayette Pho, MD Due d

## 2023-02-16 DIAGNOSIS — Z419 Encounter for procedure for purposes other than remedying health state, unspecified: Secondary | ICD-10-CM | POA: Diagnosis not present

## 2023-02-18 ENCOUNTER — Encounter: Payer: Self-pay | Admitting: *Deleted

## 2023-02-18 ENCOUNTER — Telehealth: Payer: Self-pay | Admitting: Family Medicine

## 2023-02-18 ENCOUNTER — Ambulatory Visit: Payer: Medicaid Other | Attending: Family Medicine

## 2023-02-18 ENCOUNTER — Ambulatory Visit: Payer: Medicaid Other | Admitting: *Deleted

## 2023-02-18 ENCOUNTER — Encounter: Payer: Self-pay | Admitting: Family Medicine

## 2023-02-18 VITALS — BP 113/55 | HR 67

## 2023-02-18 DIAGNOSIS — O365930 Maternal care for other known or suspected poor fetal growth, third trimester, not applicable or unspecified: Secondary | ICD-10-CM | POA: Diagnosis not present

## 2023-02-18 DIAGNOSIS — Z3A36 36 weeks gestation of pregnancy: Secondary | ICD-10-CM

## 2023-02-18 LAB — CERVICOVAGINAL ANCILLARY ONLY
Chlamydia: NEGATIVE
Comment: NEGATIVE
Comment: NEGATIVE
Comment: NORMAL
Neisseria Gonorrhea: NEGATIVE
Trichomonas: NEGATIVE

## 2023-02-18 LAB — CULTURE, BETA STREP (GROUP B ONLY): Strep Gp B Culture: POSITIVE — AB

## 2023-02-18 NOTE — Telephone Encounter (Signed)
Called to discuss results. No answer, left VM.   GBS (+) - No penicillin allergy. Will need abx at delivery.   Anatomy US - normal anatomy, limited by gestational age. EDC updated, now 6/30.   Next OB appt 6/07 with myself.   Fayette Pho, MD

## 2023-02-21 NOTE — Progress Notes (Unsigned)
  [facility] Prenatal Visit  Kathryn Luna is a 22 y.o. G2P0010 at [redacted]w[redacted]d here for routine follow up. She is dated by late anatomy US.  She reports {symptoms; pregnancy related:14538}. She reports fetal movement. She denies vaginal bleeding, contractions, or loss of fluid. See flow sheet for details.  There were no vitals filed for this visit.  A/P: Pregnancy at [redacted]w[redacted]d.  Doing well.   Routine prenatal care  Dating reviewed, dating tab is correct - now based off late anatomy US on 02/18/23 Fetal heart tones {appropriate:23337} 148 Fundal height {fundal height:23342::"within expected range. "} 33 Fetal position confirmed Vertex last appointment using ultrasound and again demonstrated on anatomy US 02/18/23.  GBS collected previously and positive. No known PCN allergy.  Repeat GC/CT collected previously and negative.  The patient does not have a history of HSV and valacyclovir is not indicated at this time.  Infant feeding choice: Both  Contraception choice: Nexplanon  Infant circumcision desired yes Influenza vaccine not administered as not influenza season.   Tdap previously administered between 27-36 weeks  @ 01/11/23 COVID booster given 01/24/23 Pregnancy education regarding preterm labor, fetal movement,  benefits of breastfeeding, contraception, fetal growth, expected weight gain, and safe infant sleep were discussed.    2. Pregnancy issues include the following and were addressed as appropriate today:  Problem list and pregnancy box updated: {yes/no:20286::"Yes"}.   Routine prenatal care: Continue aspirin, PNV, iron. Tolerating all well.  GBS (+): Discussed with patient. Plan for antibiotics at time of delivery. No known allergies.  Size-date discrepancy: Resolved now that [facility] corrected by anatomy US 6/03. *** Late to prenatal care: Initiated care around 7 months pregnant, somewhat cryptic early pregnancy. Adequate follow up and testing since.  Hep B  non-immune: Prenatal titers show non-immune. NCIR indicates 3 doses completed as infant. Plan to complete after delivery.   Follow up 1 week.    Fayette Pho, MD

## 2023-02-21 NOTE — Patient Instructions (Addendum)
Prenatal care You are entering the home stretch!  Baby can come at any time now. Have a bag of clothes ready to go.  Keep taking the iron, prenatal vitamin, and aspirin.  You'll see [place] every week until delivery.   Hepatitis B You'll need to get the vaccinations again, since you are non-immune.  This will be a series of 3 shots at 0, 1, and 6 months.  We can start these any time.   Prenatal Classes Go to [email or web address] for more information on the pregnancy and child birth classes that [place] has to offer.   Emergency Planning and start of labor -  If you experience any vaginal bleeding, leakage of fluids, don't feel your baby moving as much, or start to have contractions less than 5 minutes apart please go directly to the Maternal Assessment Unit at [facility] for evaluation.  Maternity and women's care services located on the [place] side of [facility] [place]. [facility] (Entrance C off [address]  [address] [place],  [place]  [address]     Primary care doctor graduating soon! I am graduating June 28th. You will be assigned a new PCP.  Thank you for letting me care for you! It has been wonderful getting to know you.  I will be moving go the [facility] in [place] on [address]. While I won't have a primary care clinic, you can always stop by and say hi!  If you have any questions or concerns, please do not hesitate to contact us via phone or MyChart message.   Fayette Pho, MD

## 2023-02-22 ENCOUNTER — Ambulatory Visit (INDEPENDENT_AMBULATORY_CARE_PROVIDER_SITE_OTHER): Payer: Medicaid Other | Admitting: Family Medicine

## 2023-02-22 VITALS — BP 108/60 | HR 92 | Wt 165.0 lb

## 2023-02-22 DIAGNOSIS — Z3A36 36 weeks gestation of pregnancy: Secondary | ICD-10-CM

## 2023-02-28 ENCOUNTER — Other Ambulatory Visit: Payer: Medicaid Other

## 2023-02-28 ENCOUNTER — Ambulatory Visit: Payer: Medicaid Other

## 2023-02-28 NOTE — Patient Instructions (Addendum)
It was wonderful to see you today. Thank you for allowing me to be a part of your care. Below is a short summary of what we discussed at your visit today:  Prenatal  Keep taking the aspirin, prenatal vitamin, and iron supplement. Prenatal visits every week until delivery! Your due date is estimated as June 30. Your GBS swab was positive for group B strep bacteria.  This is a normal vaginal bacteria for many women.  This means that you will simply get antibiotics at the time of delivery.  IF YOU HAVE ANOTHER NEAR-FAINTING EPISODE - Go to the maternal assessment unit (MAU) at [facility].    Prenatal Classes Go to [email or web address] for more information on the pregnancy and child birth classes that [place] has to offer.   Labor time! If you experience any vaginal bleeding, leakage of fluids, don't feel your baby moving as much, or start to have contractions less than 5 minutes apart please go directly to the Maternal Assessment Unit at [facility] for evaluation.  Maternity and women's care services located on the [place] side of [facility] [place]. [facility] (Entrance C off [address]  [address] Entrance C [place],  [place]  [address]      If you have any questions or concerns, please do not hesitate to contact us via phone or MyChart message.   Fayette Pho, MD

## 2023-03-01 ENCOUNTER — Ambulatory Visit (INDEPENDENT_AMBULATORY_CARE_PROVIDER_SITE_OTHER): Payer: Medicaid Other | Admitting: Family Medicine

## 2023-03-01 VITALS — BP 96/64 | HR 84 | Temp 98.2°F | Wt 168.4 lb

## 2023-03-01 DIAGNOSIS — Z3A37 37 weeks gestation of pregnancy: Secondary | ICD-10-CM
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
## 2023-03-01 NOTE — Progress Notes (Signed)
Follow up.  Fainting episode on Sunday 02/24/2023, weakness

## 2023-03-08 ENCOUNTER — Telehealth: Payer: Self-pay

## 2023-03-08 ENCOUNTER — Ambulatory Visit (INDEPENDENT_AMBULATORY_CARE_PROVIDER_SITE_OTHER): Payer: Medicaid Other | Admitting: Family Medicine

## 2023-03-08 VITALS — BP 100/50 | HR 87 | Wt 170.4 lb

## 2023-03-08 DIAGNOSIS — Z3A38 38 weeks gestation of pregnancy: Secondary | ICD-10-CM

## 2023-03-08 NOTE — Patient Instructions (Signed)
It was wonderful to see you today. Thank you for allowing me to be a part of your care. Below is a short summary of what we discussed at your visit today:  Pregnancy Care You are in the home stretch! Keep taking your prenatal vitamin, aspirin, and iron supplement every day. We talked about thus far as you are planning on breast-feeding bottlefeeding, Nexplanon for birth control after delivery, and an epidural for pain control.  You can always change this in the hospital, but this close the team have a heads up in case you come in and they need to act quickly. Today I will call over to get your BPP (special ultrasound) and induction scheduled for after your due date We will let you know the dates via MyChart  LABOR TIME! Woot woot! If you experience any vaginal bleeding, leakage of fluids, don't feel your baby moving as much, or start to have contractions less than 5 minutes apart please go directly to the Maternal Assessment Unit at [facility] for evaluation.  Maternity and women's care services located on the [place] side of [facility] [place]. [facility] (Entrance C off [address]).  [address] Entrance C [place],  [place]  [address]     Maternal Mental Health If you start to develop the below symptoms of depression, please reach out to [place] for an appointment. There is also a Biomedical scientist Health Hotline at ([phone] [phone]). This hotline has trained counselors, doulas, and midwifes to real-time support, information, and resources.  Feeling sad or hopeless most of the time Lack of interest in things you used to enjoy Less interest in caring for yourself (dressing, fixing hair) Trouble concentrating Trouble coping with daily tasks Constant worry about your baby Sleeping or eating too much or too little Feeling very anxious or nervous Unexplained irritability or anger Unwanted or scary thoughts Feeling that you are not a good  mother Thoughts of hurting yourself or your baby  If you feel you are experiencing a mental health crisis, please reach out to the National Suicide Prevention Hotline at 1-800-273-TALK 570-624-4638) or go directly to the [facility], open 24/7.  [phone]) [phone] [address], [place], [place] [address]   If you have any questions or concerns, please do not hesitate to contact us via phone or MyChart message.   Fayette Pho, MD

## 2023-03-08 NOTE — Progress Notes (Signed)
  [facility] Prenatal Visit  Kathryn Luna is a 22 y.o. G2P0010 at [redacted]w[redacted]d here for routine follow up. She is dated by midtrimester ultrasound.  She reports no complaints. She reports fetal movement. She denies vaginal bleeding, contractions, or loss of fluid. See flow sheet for details.  Vitals:   03/08/23 0837  BP: (!) 100/50  Pulse: 87   A/P: Pregnancy at [redacted]w[redacted]d.  Doing well.   Routine prenatal care:  Dating reviewed, dating tab is correct Fetal heart tones Appropriate 140 Fundal height within expected range.  36-37 Fetal position confirmed Vertex using Ultrasound .  Infant feeding choice: Both  Contraception choice: Nexplanon  Infant circumcision desired Undecided Pain control in labor discussed and patient desires epidural.  Influenza vaccine not administered as not influenza season.   Tdap previously administered between 27-36 weeks  @ 01/11/23 COVID booster given 01/24/23 GBS collected previously and positive. No known PCN allergy.  Repeat GC/CT collected previously and negative.  Pregnancy education regarding labor, fetal movement,  benefits of breastfeeding, contraception, and safe infant sleep were discussed.  Labor and fetal movement precautions reviewed. Induction of labor discussed. Scheduled for induction at approximately 41 weeks. BPP scheduled between 40-41 weeks.   2. Pregnancy issues include the following and were addressed as appropriate today: Problem list and pregnancy box updated: Yes.  Routine prenatal care: Continue aspirin, PNV, iron. Tolerating all well.  Pre-syncopal episode: Has not occurred since last exam.  GBS (+): Discussed with patient. Plan for antibiotics at time of delivery. No known allergies.  Size-date discrepancy: Improved now that [facility] corrected by anatomy US to 6/30, one week later than previously thought. EFW 55th %ile on anatomy US 6/03, suspect retroverted uterus contributing.  Late to prenatal care: Initiated care  around 7 months pregnant, somewhat cryptic early pregnancy. Adequate follow up and testing since.  Hep B non-immune: Prenatal titers show non-immune. NCIR indicates 3 doses completed as infant. Plan to complete after delivery.   Follow up 1 week.    Fayette Pho, MD  Fayette Pho, MD

## 2023-03-08 NOTE — Telephone Encounter (Signed)
Called patient to provide appointment information for BPP.   She did not answer, LVM asking that patient return call to office.   BPP is scheduled for 03/19/23 at 12:30 at [facility]- [address], [place] on the second floor.   Please advise patient of appointment information when she calls back.   Veronda Prude, RN

## 2023-03-12 ENCOUNTER — Telehealth (HOSPITAL_COMMUNITY): Payer: Self-pay | Admitting: *Deleted

## 2023-03-12 ENCOUNTER — Ambulatory Visit (INDEPENDENT_AMBULATORY_CARE_PROVIDER_SITE_OTHER): Payer: Medicaid Other | Admitting: Family Medicine

## 2023-03-12 VITALS — BP 98/60 | HR 86 | Temp 98.6°F | Wt 172.4 lb

## 2023-03-12 DIAGNOSIS — Z3A39 39 weeks gestation of pregnancy: Secondary | ICD-10-CM

## 2023-03-12 NOTE — Progress Notes (Signed)
  [facility] Prenatal Visit  Kathryn Luna is a 22 y.o. G2P0010 at [redacted]w[redacted]d here for routine follow up. She is dated by {Ob dating:14516}.  She reports {symptoms; pregnancy related:14538}. She reports fetal movement. She denies vaginal bleeding, contractions, or loss of fluid. See flow sheet for details.  Vitals:   03/12/23 1005  BP: 98/60  Pulse: 86  Temp: 98.6 F (37 C)   A/P: Pregnancy at [redacted]w[redacted]d.  Doing well.   Routine prenatal care:  Dating reviewed, dating tab is correct Fetal heart tones {appropriate:23337} Fundal height {fundal height:23342::"within expected range. "} Fetal position confirmed Vertex using Ultrasound  at last OB appointment ([redacted]w[redacted]d) Infant feeding choice: Both  Contraception choice: Nexplanon  Infant circumcision desired Undecided Pain control in labor discussed and patient desires epidural.  Influenza vaccine not administered as not influenza season.   Tdap previously administered between 27-36 weeks  @ 01/11/23 COVID booster given 01/24/23 GBS collected previously and positive. No known PCN allergy.  Repeat GC/CT collected previously and negative. Pregnancy education regarding labor, fetal movement,  benefits of breastfeeding, contraception, and safe infant sleep were discussed.  Labor and fetal movement precautions reviewed. Induction of labor discussed. Scheduled for induction at approximately 41 weeks. BPP scheduled between 40-41 weeks.   2. Pregnancy issues include the following and were addressed as appropriate today:  Problem list and pregnancy box updated: {yes/no:20286::"Yes"}.  Routine prenatal care: Continue aspirin, PNV, iron. Tolerating all well.  GBS (+): Discussed with patient. Plan for antibiotics at time of delivery. No known allergies.  Size-date discrepancy: Improved now that [facility] corrected by anatomy US to 6/30, one week later than previously thought. EFW 55th %ile on anatomy US 6/03, suspect retroverted uterus  contributing.  Late to prenatal care: Initiated care around 7 months pregnant, somewhat cryptic early pregnancy. Adequate follow up and testing since.  Hep B non-immune: Prenatal titers show non-immune. NCIR indicates 3 doses completed as infant. Plan to complete after delivery.   Follow up 1 week.    Fayette Pho, MD

## 2023-03-12 NOTE — Patient Instructions (Signed)
BABY TIME If you experience any vaginal bleeding, leakage of fluids, don't feel your baby moving as much, or start to have contractions less than 5 minutes apart please go directly to the Maternal Assessment Unit at [facility] for evaluation.  Maternity and women's care services located on the [place] side of [facility] [place]. [facility] (Entrance C off [address]).  [address] Entrance [address] [place],  [place]  [address]     Planning for going past 40 weeks of pregnancy: 7/2 (at 40 weeks 2 days of pregnancy): BPP ultrasound.   This is the ultrasound to make sure baby's heart rate, fluid, and growth all look good.  This is very common to do if woman goes over 40 weeks of pregnancy. Arrive by 12:20 pm [facility]   7/07 (at 41 weeks of pregnancy): Induction at the labor and delivery floor at [facility] are scheduled for a "daytime" induction This means you should expect a call from the labor and delivery floor sometime during the day letting you know when to come in Have a bag packed and ready to go!  Maternal Mental Health If you start to develop the below symptoms of depression, please reach out to [place] for an appointment. There is also a Biomedical scientist Health Hotline at [phone]). This hotline has trained counselors, doulas, and midwifes to real-time support, information, and resources.  Feeling sad or hopeless most of the time Lack of interest in things you used to enjoy Less interest in caring for yourself (dressing, fixing hair) Trouble concentrating Trouble coping with daily tasks Constant worry about your baby Sleeping or eating too much or too little Feeling very anxious or nervous Unexplained irritability or anger Unwanted or scary thoughts Feeling that you are not a good mother Thoughts of hurting yourself or your baby  If you feel you are experiencing a mental health crisis,  please reach out to the National Suicide Prevention Hotline at 1-800-273-TALK 709-095-9794) or go directly to the [facility], open 24/7.  [phone] [address] [place], [place] [address]   Please bring all of your medications to every appointment!  If you have any questions or concerns, please do not hesitate to contact us via phone or MyChart message.   Fayette Pho, MD

## 2023-03-12 NOTE — Telephone Encounter (Signed)
Preadmission screen  

## 2023-03-14 ENCOUNTER — Telehealth (HOSPITAL_COMMUNITY): Payer: Self-pay | Admitting: *Deleted

## 2023-03-14 ENCOUNTER — Encounter: Payer: Self-pay | Admitting: *Deleted

## 2023-03-14 NOTE — Telephone Encounter (Signed)
Preadmission screen  

## 2023-03-15 ENCOUNTER — Encounter: Payer: Medicaid Other | Admitting: Family Medicine

## 2023-03-15 ENCOUNTER — Encounter (HOSPITAL_COMMUNITY): Payer: Self-pay | Admitting: *Deleted

## 2023-03-15 ENCOUNTER — Telehealth (HOSPITAL_COMMUNITY): Payer: Self-pay | Admitting: *Deleted

## 2023-03-15 NOTE — Progress Notes (Signed)
  [facility] Prenatal Visit  Kathryn Luna is a 22 y.o. G2P0010 at [redacted]w[redacted]d here for routine follow up. She is dated by midtrimester ultrasound.  She reports {symptoms; pregnancy related:14538}. She reports fetal movement. She denies vaginal bleeding, contractions, or loss of fluid.  See flow sheet for details.  There were no vitals filed for this visit.   A/P: Pregnancy at [redacted]w[redacted]d.  Doing well.   Routine prenatal care:  Dating reviewed, dating tab is {correct:23336::"correct"} Fetal heart tones {appropriate:23337} Fundal height {fundal height:23342::"within expected range. "} Fetal position confirmed: confirmed Vertex using Ultrasound at ([redacted]w[redacted]d)  Infant feeding choice: Both  Contraception choice: Nexplanon  Infant circumcision desired {Response; yes/no/na:63} Pain control in labor discussed and patient desires epidural.  Influenza vaccine {given:23340}  GBS+. No known PCN allergy Tdap previously administered between 27-36 weeks  on 01/11/23 Biophysical profile is scheduled between 40w0 day and [redacted]w[redacted]d on 03/19/23. Induction of labor discussed with patient. Discussed plan with preceptor and Ob on call. Induction scheduled for 41 weeks on 03/24/23.  Labor and fetal movement precautions reviewed.  2. Pregnancy issues include the following and were addressed as appropriate today:   Problem list and pregnancy box updated: {yes/no:20286::"Yes"}.  Routine prenatal care: Continue aspirin, PNV, iron. Tolerating all well.  GBS (+): Discussed with patient. Plan for antibiotics at time of delivery. No known allergies.  Size-date discrepancy: Improved now that [facility] corrected by anatomy US to 6/30, one week later than previously thought. EFW 55th %ile on anatomy US 6/03, suspect retroverted uterus contributing.  Late to prenatal care: Initiated care around 7 months pregnant, somewhat cryptic early pregnancy. Adequate follow up and testing since.  Hep B non-immune: Prenatal titers show  non-immune. NCIR indicates 3 doses completed as infant. Plan to complete after delivery.  Scheduled for 6 week postpartum visit.

## 2023-03-15 NOTE — Telephone Encounter (Signed)
Preadmission screen  

## 2023-03-16 ENCOUNTER — Other Ambulatory Visit: Payer: Self-pay | Admitting: Advanced Practice Midwife

## 2023-03-18 ENCOUNTER — Ambulatory Visit (INDEPENDENT_AMBULATORY_CARE_PROVIDER_SITE_OTHER): Payer: Medicaid Other | Admitting: Family Medicine

## 2023-03-18 VITALS — BP 111/65 | HR 75 | Temp 98.2°F | Wt 173.2 lb

## 2023-03-18 DIAGNOSIS — Z419 Encounter for procedure for purposes other than remedying health state, unspecified: Secondary | ICD-10-CM | POA: Diagnosis not present

## 2023-03-18 DIAGNOSIS — Z3A40 40 weeks gestation of pregnancy: Secondary | ICD-10-CM | POA: Diagnosis not present

## 2023-03-18 DIAGNOSIS — O480 Post-term pregnancy: Secondary | ICD-10-CM | POA: Diagnosis not present

## 2023-03-18 NOTE — Patient Instructions (Signed)
Your induction is scheduled for 7/7. If you go into labor before that time please go to the hospital and be checked out. Contractions are regular if they are every 3-5 minutes and uncomfortable. If you feel a gush of fluid or have any vaginal bleeding, please be seen right away.   Pregnancy Related Return Precautions The follow are signs/symptoms that are abnormal in pregnancy and may require further evaluation by a physician: Go to the MAU at [facility] at [facility] if: You have cramping/contractions that do not go away with drinking water, especially if they are lasting 30 seconds to 1.5 minutes, coming and going every 5-10 minutes for an hour or more, or are getting stronger and you cannot walk or talk while having a contraction/cramp. Your water breaks.  Sometimes it is a big gush of fluid, sometimes it is just a trickle that keeps getting your underwear wet or running down your legs You have vaginal bleeding.    You do not feel your baby moving like normal.  If you do not, get something to eat and drink (something cold or something with sugar like peanut butter or juice) and lay down and focus on feeling your baby move. If your baby is still not moving like normal, you should go to MAU. You should feel your baby move 6 times in one hour, or 10 times in two hours. You have a persistent headache that does not go away with 1 g of Tylenol, vision changes, chest pain, difficulty breathing, severe pain in your right upper abdomen, worsening leg swelling- these can all be signs of high blood pressure in pregnancy and need to be evaluated by a provider immediately  These are all concerning in pregnancy and if you have any of these I recommend you call your PCP and present to the Maternity Admissions Unit (map below) for further evaluation.  For any pregnancy-related emergencies, please go to the Maternity Admissions Unit in the [facility] at [facility]. You will  use hospital Entrance C.    Our clinic number is [phone].   Dr Miquel Dunn

## 2023-03-19 ENCOUNTER — Other Ambulatory Visit: Payer: Self-pay | Admitting: Family Medicine

## 2023-03-19 ENCOUNTER — Ambulatory Visit: Payer: Medicaid Other | Attending: Family Medicine

## 2023-03-19 ENCOUNTER — Ambulatory Visit: Payer: Medicaid Other | Admitting: *Deleted

## 2023-03-19 VITALS — BP 110/63 | HR 72

## 2023-03-19 DIAGNOSIS — Z3A38 38 weeks gestation of pregnancy: Secondary | ICD-10-CM

## 2023-03-19 DIAGNOSIS — O365990 Maternal care for other known or suspected poor fetal growth, unspecified trimester, not applicable or unspecified: Secondary | ICD-10-CM | POA: Insufficient documentation

## 2023-03-19 DIAGNOSIS — O480 Post-term pregnancy: Secondary | ICD-10-CM | POA: Insufficient documentation

## 2023-03-19 DIAGNOSIS — Z3A40 40 weeks gestation of pregnancy: Secondary | ICD-10-CM | POA: Diagnosis not present

## 2023-03-20 ENCOUNTER — Encounter (HOSPITAL_COMMUNITY): Payer: Self-pay | Admitting: Family Medicine

## 2023-03-20 ENCOUNTER — Inpatient Hospital Stay (EMERGENCY_DEPARTMENT_HOSPITAL)
Admission: AD | Admit: 2023-03-20 | Discharge: 2023-03-21 | Disposition: A | Discharge status: Home / Self Care | Payer: Medicaid Other | Source: Home / Self Care | Attending: Family Medicine | Admitting: Family Medicine

## 2023-03-20 ENCOUNTER — Inpatient Hospital Stay (HOSPITAL_COMMUNITY)
Admission: AD | Admit: 2023-03-20 | Discharge: 2023-03-20 | Disposition: A | Discharge status: Home / Self Care | Payer: Medicaid Other | Attending: Family Medicine | Admitting: Family Medicine

## 2023-03-20 DIAGNOSIS — O479 False labor, unspecified: Secondary | ICD-10-CM

## 2023-03-20 DIAGNOSIS — O480 Post-term pregnancy: Secondary | ICD-10-CM | POA: Diagnosis not present

## 2023-03-20 DIAGNOSIS — O471 False labor at or after 37 completed weeks of gestation: Secondary | ICD-10-CM | POA: Insufficient documentation

## 2023-03-20 DIAGNOSIS — Z3A40 40 weeks gestation of pregnancy: Secondary | ICD-10-CM | POA: Insufficient documentation

## 2023-03-20 NOTE — MAU Provider Note (Signed)
Kathryn Luna is a 22 y.o. G2P0010 female at [redacted]w[redacted]d  RN Labor check, not seen by provider SVE by RN: Dilation: 1 Effacement (%): 50 Station: -2 Exam by:: A English as a second language teacher x2 NST: FHR baseline 130 bpm, Variability: moderate, Accelerations:present, Decelerations:  Absent= Cat 1/Reactive Toco: irregular, every 2-7 minutes  D/C home  Cheral Marker CNM, [facility] 03/20/2023 4:31 AM

## 2023-03-20 NOTE — MAU Note (Signed)
.  Kathryn Luna is a 22 y.o. at [redacted]w[redacted]d here in MAU reporting ctxs since 1500 Tues. Ctxs have gotten closer and stronger. Reports good FM and denies LOF or VB. No recent sve  Onset of complaint: 1500 Tues Pain score: 7 Vitals:   03/20/23 0248 03/20/23 0250  BP:  111/62  Pulse: 74   Resp: 17   Temp: 97.7 F (36.5 C)   SpO2: 100%      FHT:137 Lab orders placed from triage:  mau labor eval

## 2023-03-21 DIAGNOSIS — Z3A40 40 weeks gestation of pregnancy: Secondary | ICD-10-CM | POA: Diagnosis not present

## 2023-03-21 DIAGNOSIS — O471 False labor at or after 37 completed weeks of gestation: Secondary | ICD-10-CM | POA: Diagnosis not present

## 2023-03-21 DIAGNOSIS — O480 Post-term pregnancy: Secondary | ICD-10-CM | POA: Diagnosis not present

## 2023-03-21 NOTE — MAU Note (Signed)
.  Kathryn Luna is a 22 y.o. at [redacted]w[redacted]d here in MAU reporting:   Contractions every: 5-8 minutes Onset of ctx: Yesterday Pain score: 10/10  ROM: Intact Vaginal Bleeding: None Last SVE: 1 cm Fetal Movement: Reports positive FM

## 2023-03-21 NOTE — MAU Provider Note (Signed)
Kathryn Luna is a 22 y.o. G72P0010 female at [redacted]w[redacted]d  RN Labor check, not seen by provider SVE by RN: Dilation: 1.5 Effacement (%): 50 Station: -1 Exam by:: A Sherlon Handing RN NST: FHR baseline 130s bpm, Variability: moderate, Accelerations:present, Decelerations:  Absent= Cat 1/Reactive Toco: irregular, every 3-7 minutes  D/C home; for IOL 03/24/23 if no labor  Arabella Merles CNM 03/21/2023 1:48 AM

## 2023-03-22 ENCOUNTER — Inpatient Hospital Stay (HOSPITAL_COMMUNITY): Payer: Medicaid Other | Admitting: Anesthesiology

## 2023-03-22 ENCOUNTER — Other Ambulatory Visit: Payer: Self-pay

## 2023-03-22 ENCOUNTER — Inpatient Hospital Stay (HOSPITAL_COMMUNITY)
Admission: AD | Admit: 2023-03-22 | Discharge: 2023-03-24 | DRG: 807 | Disposition: A | Discharge status: Home / Self Care | Payer: Medicaid Other | Attending: Obstetrics and Gynecology | Admitting: Obstetrics and Gynecology

## 2023-03-22 ENCOUNTER — Encounter (HOSPITAL_COMMUNITY): Payer: Self-pay | Admitting: Obstetrics and Gynecology

## 2023-03-22 DIAGNOSIS — Z3A40 40 weeks gestation of pregnancy: Secondary | ICD-10-CM

## 2023-03-22 DIAGNOSIS — Z3A00 Weeks of gestation of pregnancy not specified: Secondary | ICD-10-CM | POA: Diagnosis not present

## 2023-03-22 DIAGNOSIS — Z30017 Encounter for initial prescription of implantable subdermal contraceptive: Secondary | ICD-10-CM

## 2023-03-22 DIAGNOSIS — O480 Post-term pregnancy: Principal | ICD-10-CM | POA: Diagnosis present

## 2023-03-22 DIAGNOSIS — O99820 Streptococcus B carrier state complicating pregnancy: Secondary | ICD-10-CM | POA: Diagnosis not present

## 2023-03-22 DIAGNOSIS — O99824 Streptococcus B carrier state complicating childbirth: Secondary | ICD-10-CM | POA: Diagnosis present

## 2023-03-22 DIAGNOSIS — Z7982 Long term (current) use of aspirin: Secondary | ICD-10-CM | POA: Diagnosis not present

## 2023-03-22 DIAGNOSIS — Z975 Presence of (intrauterine) contraceptive device: Secondary | ICD-10-CM

## 2023-03-22 LAB — TYPE AND SCREEN
ABO/RH(D): A POS
Antibody Screen: NEGATIVE

## 2023-03-22 LAB — CBC
HCT: 36.6 % (ref 36.0–46.0)
Hemoglobin: 12.3 g/dL (ref 12.0–15.0)
MCH: 30.4 pg (ref 26.0–34.0)
MCHC: 33.6 g/dL (ref 30.0–36.0)
MCV: 90.4 fL (ref 80.0–100.0)
Platelets: 235 10*3/uL (ref 150–400)
RBC: 4.05 MIL/uL (ref 3.87–5.11)
RDW: 14.2 % (ref 11.5–15.5)
WBC: 11.8 10*3/uL — ABNORMAL HIGH (ref 4.0–10.5)
nRBC: 0 % (ref 0.0–0.2)

## 2023-03-22 MED ORDER — OXYCODONE-ACETAMINOPHEN 5-325 MG PO TABS: 2.0000 | ORAL_TABLET | ORAL | Status: DC | PRN

## 2023-03-22 MED ORDER — LACTATED RINGERS IV SOLN: 500.0000 mL | Freq: Once | INTRAVENOUS | Status: DC

## 2023-03-22 MED ORDER — SOD CITRATE-CITRIC ACID 500-334 MG/5ML PO SOLN: 30.0000 mL | ORAL | Status: DC | PRN

## 2023-03-22 MED ORDER — EPHEDRINE 5 MG/ML INJ: 10.0000 mg | INTRAVENOUS | Status: DC | PRN

## 2023-03-22 MED ORDER — LIDOCAINE HCL (PF) 1 % IJ SOLN
INTRAMUSCULAR | Status: DC | PRN
  Administered 2023-03-22: 8 mL via EPIDURAL

## 2023-03-22 MED ORDER — DIPHENHYDRAMINE HCL 50 MG/ML IJ SOLN: 12.5000 mg | INTRAMUSCULAR | Status: DC | PRN

## 2023-03-22 MED ORDER — OXYTOCIN BOLUS FROM INFUSION
333.0000 mL | Freq: Once | INTRAVENOUS | Status: AC
  Administered 2023-03-22: 333 mL via INTRAVENOUS

## 2023-03-22 MED ORDER — PHENYLEPHRINE 80 MCG/ML (10ML) SYRINGE FOR IV PUSH (FOR BLOOD PRESSURE SUPPORT): 80.0000 ug | PREFILLED_SYRINGE | INTRAVENOUS | Status: DC | PRN

## 2023-03-22 MED ORDER — LIDOCAINE HCL (PF) 1 % IJ SOLN: 30.0000 mL | INTRAMUSCULAR | Status: DC | PRN

## 2023-03-22 MED ORDER — FENTANYL-BUPIVACAINE-NACL 0.5-0.125-0.9 MG/250ML-% EP SOLN
12.0000 mL/h | EPIDURAL | Status: DC | PRN
  Administered 2023-03-22: 12 mL/h via EPIDURAL
  Filled 2023-03-22: qty 250

## 2023-03-22 MED ORDER — ACETAMINOPHEN 325 MG PO TABS: 650.0000 mg | ORAL_TABLET | ORAL | Status: DC | PRN

## 2023-03-22 MED ORDER — METHYLERGONOVINE MALEATE 0.2 MG/ML IJ SOLN: 0.2000 mg | Freq: Once | INTRAMUSCULAR | Status: AC

## 2023-03-22 MED ORDER — METHYLERGONOVINE MALEATE 0.2 MG/ML IJ SOLN
INTRAMUSCULAR | Status: AC
  Administered 2023-03-22: 0.2 mg
  Filled 2023-03-22: qty 1

## 2023-03-22 MED ORDER — FLEET ENEMA 7-19 GM/118ML RE ENEM: 1.0000 | ENEMA | RECTAL | Status: DC | PRN

## 2023-03-22 MED ORDER — OXYTOCIN-SODIUM CHLORIDE 30-0.9 UT/500ML-% IV SOLN
2.5000 [IU]/h | INTRAVENOUS | Status: DC
  Administered 2023-03-23: 2.5 [IU]/h via INTRAVENOUS
  Filled 2023-03-22: qty 500

## 2023-03-22 MED ORDER — ONDANSETRON HCL 4 MG/2ML IJ SOLN: 4.0000 mg | Freq: Four times a day (QID) | INTRAMUSCULAR | Status: DC | PRN

## 2023-03-22 MED ORDER — LACTATED RINGERS IV SOLN: 500.0000 mL | INTRAVENOUS | Status: DC | PRN

## 2023-03-22 MED ORDER — OXYCODONE-ACETAMINOPHEN 5-325 MG PO TABS: 1.0000 | ORAL_TABLET | ORAL | Status: DC | PRN

## 2023-03-22 MED ORDER — FENTANYL-BUPIVACAINE-NACL 0.5-0.125-0.9 MG/250ML-% EP SOLN: 12.0000 mL/h | EPIDURAL | Status: DC | PRN

## 2023-03-22 MED ORDER — SODIUM CHLORIDE 0.9 % IV SOLN
2.0000 g | Freq: Once | INTRAVENOUS | Status: AC
  Administered 2023-03-22: 2 g via INTRAVENOUS
  Filled 2023-03-22: qty 2000

## 2023-03-22 MED ORDER — SODIUM CHLORIDE 0.9 % IV SOLN
1.0000 g | INTRAVENOUS | Status: DC
  Administered 2023-03-22: 1 g via INTRAVENOUS
  Filled 2023-03-22: qty 1000

## 2023-03-22 MED ORDER — LACTATED RINGERS IV SOLN: INTRAVENOUS | Status: DC

## 2023-03-22 NOTE — Discharge Summary (Signed)
Postpartum Discharge Summary  Date of Service updated***     Patient Name: Kathryn Luna DOB: 01/26/2001 MRN: [id number]  Date of admission: 03/22/2023 Delivery date:03/22/2023  Delivering provider: Myrtie Hawk  Date of discharge: 03/22/2023  Admitting diagnosis: Normal labor and delivery [O80] Intrauterine pregnancy: [redacted]w[redacted]d     Secondary diagnosis:  Principal Problem:   Vaginal delivery  Additional problems: ***    Discharge diagnosis: Term Pregnancy Delivered                                              Post partum procedures:{Postpartum procedures:23558} Augmentation: AROM Complications: None  Hospital course: Onset of Labor With Vaginal Delivery      22 y.o. yo G2P0010 at [redacted]w[redacted]d was admitted in Active Labor on 03/22/2023. Labor course was complicated by none  Membrane Rupture Time/Date: 10:42 PM ,03/22/2023   Delivery Method:Vaginal, Spontaneous  Episiotomy: None  Lacerations:  1st degree  Patient had a postpartum course complicated by ***.  She is ambulating, tolerating a regular diet, passing flatus, and urinating well. Patient is discharged home in stable condition on 03/22/23.  Newborn Data: Birth date:03/22/2023  Birth time:11:24 PM  Gender:Female  Living status:Living  Apgars:9 ,9  Weight:   Magnesium Sulfate received: {Mag received:30440022} BMZ received: No Rhophylac:N/A MMR:N/A T-DaP:Given prenatally Flu: No Transfusion:{Transfusion received:30440034}  Physical exam  Vitals:   03/22/23 2200 03/22/23 2230 03/22/23 2300 03/22/23 2330  BP: (!) 109/50 114/72 119/81 (!) 114/59  Pulse: 78 72 77 78  Resp:      Temp:   97.9 F (36.6 C)   TempSrc:   Oral   SpO2:      Weight:      Height:       General: {Exam; general:21111117} Lochia: {Desc; appropriate/inappropriate:30686::"appropriate"} Uterine Fundus: {Desc; firm/soft:30687} Incision: {Exam; incision:21111123} DVT Evaluation: {Exam; dvt:2111122} Labs: Lab Results  Component Value  Date   WBC 11.8 (H) 03/22/2023   HGB 12.3 03/22/2023   HCT 36.6 03/22/2023   MCV 90.4 03/22/2023   PLT 235 03/22/2023      Latest Ref Rng & Units 10/16/2022   12:01 PM  CMP  Glucose 70 - 99 mg/dL 81   BUN 6 - 20 mg/dL 5   Creatinine 4.09 - 8.11 mg/dL 9.14   Sodium 782 - 956 mmol/L 136   Potassium 3.5 - 5.2 mmol/L 4.1   Chloride 96 - 106 mmol/L 102   CO2 20 - 29 mmol/L 18   Calcium 8.7 - 10.2 mg/dL 8.8    Edinburgh Score:     No data to display           After visit meds:  Allergies as of 03/22/2023   No Known Allergies   Med Rec must be completed prior to using this [facility]***        Discharge home in stable condition Infant Feeding: {Baby feeding:23562} Infant Disposition:{CHL IP OB HOME WITH OZHYQM:57846} Discharge instruction: per After Visit Summary and Postpartum booklet. Activity: Advance as tolerated. Pelvic rest for 6 weeks.  Diet: {OB NGEX:52841324} Future Appointments: Future Appointments  Date Time Provider Department Center  05/06/2023  1:30 PM Darral Dash, DO [facility]   Follow up Visit: Message sent to [facility] 7/5  Please schedule this patient for a In person postpartum visit in 6 weeks with the following provider: Any provider. Additional Postpartum F/U: n/a   Low risk  pregnancy complicated by:  n/a Delivery mode:  Vaginal, Spontaneous  Anticipated Birth Control:  Nexplanon   03/22/2023 Myrtie Hawk, DO

## 2023-03-22 NOTE — H&P (Addendum)
OBSTETRIC ADMISSION HISTORY AND PHYSICAL  Kathryn Luna is a 22 y.o. female G2P0010 with IUP at [redacted]w[redacted]d by [place] presenting for SOL. She reports +FMs, No LOF, no VB, no blurry vision, headaches or peripheral edema, and RUQ pain.  She plans on breast and formula  feeding. She request Nexplanon for birth control. She received her prenatal care at  [facility]    Dating: By 29 wk [place] --->  Estimated Date of Delivery: 03/17/23  Sono:    @[redacted]w[redacted]d , CWD, normal anatomy, cephalic presentation, posterior lie, 3464g, 33% EFW   Prenatal History/Complications: None  Past Medical History: Past Medical History:  Diagnosis Date   BMI (body mass index), pediatric, 85% to less than 95% for age 56/28/2016   Contraception management 11/30/2021   Dizziness 06/30/2021   Engages in vaping 11/30/2021   Flat feet 01/13/2015   History of elective abortion 11/30/2021   Hordeolum 07/24/2021   Knee pain, bilateral 01/13/2015   Migraine 06/30/2021   Murmur 10/16/2022   Near syncope 10/16/2022   Poor fetal growth affecting management of mother in third trimester 02/15/2023   Postcoital bleeding 08/15/2021   Pregnancy 08/15/2021    Nursing Staff Provider Office Location  [facility]  Dating  LMP (07/14/2021) Language   English Anatomy US   Flu Vaccine  Administered previously  Genetic Screen  NIPS:   AFP:   First Screen:  Quad:   TDaP vaccine    Hgb A1C or  GTT Early  Third trimester  Rhogam     LAB RESULTS  Feeding Plan  Blood Type    Contraception  Antibody   Circumcision  Rubella   Pediatrician   RPR    Support Person Friend and   Pregnancy 12/28/2022   NURSING   PROVIDER  Office Location  [facility]  Dating by  U/S at 29.2 wks  [facility] Model  Traditional  Anatomy U/S  5/16 at MFM  Initiated care at   29wks                   Language   English                 LAB RESULTS   Support Person  Ephriam Knuckles (boyfriend)  Genetics  Panorama: low risk        NT/IT (FT only)           Carrier Screen  Horizon: negative  Rhogam   A/Positive/-- (03/25  1216)  A1C/GTT  Early   Screening for genetic disease carrier status 01/09/2023   Negative Horizon screen from [place]. Resulted 01/08/23.    Unintentional weight loss 06/30/2021    Past Surgical History: Past Surgical History:  Procedure Laterality Date   NO PAST SURGERIES      Obstetrical History: OB History     Gravida  2   Para  0   Term  0   Preterm  0   AB  1   Living  0      SAB      IAB  1   Ectopic      Multiple      Live Births              Social History Social History   Socioeconomic History   Marital status: Single    Spouse name: Not on file   Number of children: Not on file   Years of education: Not on file   Highest education level: Not on file  Occupational History  Not on file  Tobacco Use   Smoking status: Never   Smokeless tobacco: Not on file  Vaping Use   Vaping Use: Former   Substances: Nicotine  Substance and Sexual Activity   Alcohol use: Not Currently   Drug use: Never   Sexual activity: Not Currently  Other Topics Concern   Not on file  Social History Narrative   Not on file   Social Determinants of Health   Financial Resource Strain: Not on file  Food Insecurity: Not on file  Transportation Needs: Not on file  Physical Activity: Not on file  Stress: Not on file  Social Connections: Not on file    Family History: Family History  Problem Relation Age of Onset   Diabetes Maternal Grandfather     Allergies: No Known Allergies  Medications Prior to Admission  Medication Sig Dispense Refill Last Dose   aspirin EC 81 MG tablet Take 1 tablet (81 mg total) by mouth daily. Swallow whole. 90 tablet 1    ferrous sulfate 325 (65 FE) MG tablet Take 1 tablet (325 mg total) by mouth every other day. 90 tablet 3    Prenatal Vit-Fe Fumarate-FA (MULTIVITAMIN-PRENATAL) 27-0.8 MG TABS tablet Take 1 tablet by mouth daily at 12 noon. 90 tablet 3      Review of Systems   All systems reviewed and negative except as  stated in HPI  Blood pressure 121/79, pulse (!) 103. General appearance: alert, cooperative, and appears stated age Lungs: clear to auscultation bilaterally Heart: regular rate and rhythm Abdomen: soft, non-tender; bowel sounds normal Extremities: Homans sign is negative, no sign of DVT  Presentation: cephalic Fetal monitoringBaseline: 135 bpm, Variability: Good {> 6 bpm), Accelerations: Reactive, and Decelerations: Absent Uterine activityFrequency: Every 5 minutes     Prenatal labs: ABO, Rh: A/Positive/-- (03/25 1216) Antibody: Negative (03/25 1216) Rubella: 1.54 (03/25 1216) RPR: Non Reactive (03/25 1216)  HBsAg: Negative (03/25 1216)  HIV: Non Reactive (03/25 1216)  GBS: Positive/-- (05/31 1021)  1 hr Glucola Negg Genetic screening  LR female Anatomy US Normal  Prenatal Transfer Tool  Maternal Diabetes: No Genetic Screening: Normal Maternal Ultrasounds/Referrals: Normal Fetal Ultrasounds or other Referrals:  None Maternal Substance Abuse:  No Significant Maternal Medications:  None Significant Maternal Lab Results:  Group B Strep positive Number of Prenatal Visits:greater than 3 verified prenatal visits Other Comments:  None  No results found for this or any previous visit (from the past 24 hour(s)).  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 G2P0010 at [redacted]w[redacted]d here for SOL  #Labor:Progressing spontaneously. Continue to monitor and AROM after epidural and 4 hours if able  #Pain: Epidural requested  #FWB: Cat 1 #ID:  GBS positive, ampicillin  #MOF: Both #MOC:Nexplanon  #Circ:  Yes   Celedonio Savage, MD  03/22/2023, 6:34 PM

## 2023-03-22 NOTE — Anesthesia Procedure Notes (Signed)
Epidural Patient location during procedure: OB Start time: 03/22/2023 7:47 PM End time: 03/22/2023 8:07 PM  Staffing Anesthesiologist: Bethena Midget, MD  Preanesthetic Checklist Completed: patient identified, IV checked, site marked, risks and benefits discussed, surgical consent, monitors and equipment checked, pre-op evaluation and timeout performed  Epidural Patient position: sitting Prep: DuraPrep and site prepped and draped Patient monitoring: continuous pulse ox and blood pressure Approach: midline Location: L3-L4 Injection technique: LOR air  Needle:  Needle type: Tuohy  Needle gauge: 17 G Needle length: 9 cm and 9 Needle insertion depth: 7 cm Catheter type: closed end flexible Catheter size: 19 Gauge Catheter at skin depth: 13 cm Test dose: negative  Assessment Events: blood not aspirated, no cerebrospinal fluid, injection not painful, no injection resistance, no paresthesia and negative IV test

## 2023-03-22 NOTE — Progress Notes (Signed)
Labor Progress Note  Kathryn Luna is a 22 y.o. G2P0010 at [redacted]w[redacted]d presented for labor  S: Patient doing well, has epidural now.  O:  BP 114/72   Pulse 72   Temp 98.3 F (36.8 C) (Oral)   Resp 17   Ht 5\' 2"  (1.575 m)   Wt 78.5 kg   LMP  (LMP Unknown)   SpO2 100%   BMI 31.64 kg/m  EFM: 140 bpm/Moderate variability/ 15x15 accels/ None decels CAT: 1 Toco: regular, every 3 to 4 minutes   CVE: Dilation: 8 Effacement (%): 100 Cervical Position: Middle Station: 0 Presentation: Vertex Exam by:: Dr. Camelia Phenes   A&P: 22 y.o. G2P0010 [redacted]w[redacted]d  here for labor as above  #Labor: Progressing well.  AROM clear 1040 #Pain: Family/Friend support and Epidural #FWB: CAT 1 #GBS positive-penicillin ongoing  Kathryn Hawk, DO FMOB Fellow, Faculty practice [facility], Center for [facility] 03/22/23  10:48 PM

## 2023-03-22 NOTE — Anesthesia Preprocedure Evaluation (Signed)
Anesthesia Evaluation  Patient identified by MRN, date of birth, ID band Patient awake    Reviewed: Allergy & Precautions, H&P , NPO status , Patient's Chart, lab work & pertinent test results, reviewed documented beta blocker date and time   Airway Mallampati: I  TM Distance: >3 FB Neck ROM: full    Dental no notable dental hx. (+) Teeth Intact, Dental Advisory Given   Pulmonary neg pulmonary ROS   Pulmonary exam normal breath sounds clear to auscultation       Cardiovascular negative cardio ROS Normal cardiovascular exam+ Valvular Problems/Murmurs  Rhythm:regular Rate:Normal     Neuro/Psych  Headaches negative neurological ROS  negative psych ROS   GI/Hepatic negative GI ROS, Neg liver ROS,,,  Endo/Other  negative endocrine ROS    Renal/GU negative Renal ROS  negative genitourinary   Musculoskeletal   Abdominal   Peds  Hematology negative hematology ROS (+)   Anesthesia Other Findings   Reproductive/Obstetrics (+) Pregnancy                             Anesthesia Physical Anesthesia Plan  ASA: 2  Anesthesia Plan: Epidural   Post-op Pain Management: Minimal or no pain anticipated   Induction: Intravenous  PONV Risk Score and Plan: 2 and Treatment may vary due to age or medical condition  Airway Management Planned: Natural Airway and Simple Face Mask  Additional Equipment: None  Intra-op Plan:   Post-operative Plan:   Informed Consent: I have reviewed the patients History and Physical, chart, labs and discussed the procedure including the risks, benefits and alternatives for the proposed anesthesia with the patient or authorized representative who has indicated his/her understanding and acceptance.       Plan Discussed with: Anesthesiologist and CRNA  Anesthesia Plan Comments:        Anesthesia Quick Evaluation

## 2023-03-22 NOTE — MAU Note (Signed)
.  Sahira Bex is a 22 y.o. at [redacted]w[redacted]d here in MAU reporting: ctx since yesterday, states they are now every 2-3 minutes. Also having some bloody show. Denies LOF. +FM. GBS pos, wants epidural.   Pain score: 10

## 2023-03-22 NOTE — Plan of Care (Signed)
  Problem: Education: Goal: Knowledge of Childbirth will improve Outcome: Adequate for Discharge Goal: Ability to make informed decisions regarding treatment and plan of care will improve Outcome: Adequate for Discharge Goal: Ability to state and carry out methods to decrease the pain will improve Outcome: Adequate for Discharge Goal: Individualized Educational Video(s) Outcome: Not Met (add Reason)   Problem: Coping: Goal: Ability to verbalize concerns and feelings about labor and delivery will improve Outcome: Adequate for Discharge   Problem: Life Cycle: Goal: Ability to make normal progression through stages of labor will improve Outcome: Adequate for Discharge Goal: Ability to effectively push during vaginal delivery will improve Outcome: Completed/Met   Problem: Role Relationship: Goal: Will demonstrate positive interactions with the child Outcome: Completed/Met   Problem: Safety: Goal: Risk of complications during labor and delivery will decrease Outcome: Completed/Met   Problem: Pain Management: Goal: Relief or control of pain from uterine contractions will improve Outcome: Adequate for Discharge

## 2023-03-22 NOTE — Progress Notes (Signed)
Labor Progress Note  Kathryn Luna is a 22 y.o. G2P0010 at [redacted]w[redacted]d presented for labor  S: Patient doing well, has epidural now.  O:  BP 108/67   Pulse 83   Temp 98.3 F (36.8 C) (Oral)   Resp 17   Ht 5\' 2"  (1.575 m)   Wt 78.5 kg   LMP  (LMP Unknown)   SpO2 100%   BMI 31.64 kg/m  EFM: 140 bpm/Moderate variability/ 15x15 accels/ None decels CAT: 1 Toco: regular, every 3 to 4 minutes   CVE: Dilation: 7 Effacement (%): 90 Cervical Position: Middle Station: -1 Presentation: Vertex Exam by:: Karl Ito, rnc   A&P: 22 y.o. G2P0010 [redacted]w[redacted]d  here for labor as above  #Labor: Progressing well.  Plan to recheck SVE at around 10 PM.  If patient still is 7 cm will plan for AROM and avoidance of arrest of dilation. #Pain: Family/Friend support and Epidural #FWB: CAT 1 #GBS positive-penicillin ongoing  Myrtie Hawk, DO FMOB Fellow, Faculty practice [facility], Center for [facility] 03/22/23  9:40 PM

## 2023-03-23 ENCOUNTER — Encounter (HOSPITAL_COMMUNITY): Payer: Self-pay | Admitting: Obstetrics and Gynecology

## 2023-03-23 LAB — CBC
HCT: 32.4 % — ABNORMAL LOW (ref 36.0–46.0)
Hemoglobin: 10.8 g/dL — ABNORMAL LOW (ref 12.0–15.0)
MCH: 31 pg (ref 26.0–34.0)
MCHC: 33.3 g/dL (ref 30.0–36.0)
MCV: 93.1 fL (ref 80.0–100.0)
Platelets: 204 10*3/uL (ref 150–400)
RBC: 3.48 MIL/uL — ABNORMAL LOW (ref 3.87–5.11)
RDW: 14.5 % (ref 11.5–15.5)
WBC: 13.1 10*3/uL — ABNORMAL HIGH (ref 4.0–10.5)
nRBC: 0 % (ref 0.0–0.2)

## 2023-03-23 LAB — RPR: RPR Ser Ql: NONREACTIVE

## 2023-03-23 MED ORDER — SODIUM CHLORIDE 0.9% FLUSH: 3.0000 mL | INTRAVENOUS | Status: DC | PRN

## 2023-03-23 MED ORDER — LIDOCAINE HCL 1 % IJ SOLN
0.0000 mL | Freq: Once | INTRAMUSCULAR | Status: AC | PRN
  Administered 2023-03-24: 3 mL via INTRADERMAL
  Filled 2023-03-23: qty 20

## 2023-03-23 MED ORDER — SODIUM CHLORIDE 0.9% FLUSH
3.0000 mL | Freq: Two times a day (BID) | INTRAVENOUS | Status: DC
  Administered 2023-03-23: 3 mL via INTRAVENOUS

## 2023-03-23 MED ORDER — COCONUT OIL OIL: 1.0000 | TOPICAL_OIL | Status: DC | PRN

## 2023-03-23 MED ORDER — ZOLPIDEM TARTRATE 5 MG PO TABS: 5.0000 mg | ORAL_TABLET | Freq: Every evening | ORAL | Status: DC | PRN

## 2023-03-23 MED ORDER — SENNOSIDES-DOCUSATE SODIUM 8.6-50 MG PO TABS
2.0000 | ORAL_TABLET | ORAL | Status: DC
  Administered 2023-03-23 – 2023-03-24 (×2): 2 via ORAL
  Filled 2023-03-23 (×2): qty 2

## 2023-03-23 MED ORDER — DIPHENHYDRAMINE HCL 25 MG PO CAPS: 25.0000 mg | ORAL_CAPSULE | Freq: Four times a day (QID) | ORAL | Status: DC | PRN

## 2023-03-23 MED ORDER — ACETAMINOPHEN 325 MG PO TABS: 650.0000 mg | ORAL_TABLET | ORAL | Status: DC | PRN

## 2023-03-23 MED ORDER — BENZOCAINE-MENTHOL 20-0.5 % EX AERO
1.0000 | INHALATION_SPRAY | CUTANEOUS | Status: DC | PRN
  Administered 2023-03-23: 1 via TOPICAL
  Filled 2023-03-23: qty 56

## 2023-03-23 MED ORDER — ONDANSETRON HCL 4 MG PO TABS: 4.0000 mg | ORAL_TABLET | ORAL | Status: DC | PRN

## 2023-03-23 MED ORDER — WITCH HAZEL-GLYCERIN EX PADS: 1.0000 | MEDICATED_PAD | CUTANEOUS | Status: DC | PRN

## 2023-03-23 MED ORDER — PRENATAL MULTIVITAMIN CH
1.0000 | ORAL_TABLET | Freq: Every day | ORAL | Status: DC
  Administered 2023-03-23 – 2023-03-24 (×2): 1 via ORAL
  Filled 2023-03-23 (×2): qty 1

## 2023-03-23 MED ORDER — ETONOGESTREL 68 MG ~~LOC~~ IMPL
68.0000 mg | DRUG_IMPLANT | Freq: Once | SUBCUTANEOUS | Status: AC
  Administered 2023-03-24: 68 mg via SUBCUTANEOUS
  Filled 2023-03-23: qty 1

## 2023-03-23 MED ORDER — DIBUCAINE (PERIANAL) 1 % EX OINT: 1.0000 | TOPICAL_OINTMENT | CUTANEOUS | Status: DC | PRN

## 2023-03-23 MED ORDER — IBUPROFEN 600 MG PO TABS
600.0000 mg | ORAL_TABLET | Freq: Four times a day (QID) | ORAL | Status: DC
  Administered 2023-03-23 – 2023-03-24 (×6): 600 mg via ORAL
  Filled 2023-03-23 (×6): qty 1

## 2023-03-23 MED ORDER — SODIUM CHLORIDE 0.9 % IV SOLN: 250.0000 mL | INTRAVENOUS | Status: DC | PRN

## 2023-03-23 MED ORDER — ONDANSETRON HCL 4 MG/2ML IJ SOLN: 4.0000 mg | INTRAMUSCULAR | Status: DC | PRN

## 2023-03-23 MED ORDER — SIMETHICONE 80 MG PO CHEW: 80.0000 mg | CHEWABLE_TABLET | ORAL | Status: DC | PRN

## 2023-03-23 NOTE — Progress Notes (Signed)
Post Partum Day 1 Subjective: no complaints, up ad lib, voiding and tolerating PO, small lochia, plans to breastfeed, plans to bottle feed,  wants nexplanon, declined insertion today, prefers tomorrow.  Hasn't latched baby yet--states was told "formula first" so has just been feeding formula.   Objective: Blood pressure 101/73, pulse 78, temperature 98 F (36.7 C), temperature source Oral, resp. rate 17, height 5\' 2"  (1.575 m), weight 78.5 kg, SpO2 99 %, unknown if currently breastfeeding.  Physical Exam:  General: alert, cooperative and no distress Lochia:normal flow Chest: CTAB Heart: RRR no m/r/g Abdomen: +BS, soft, nontender,  Uterine Fundus: firm DVT Evaluation: No evidence of DVT seen on physical exam. Extremities: no edema  Recent Labs    03/22/23 1847 03/23/23 0519  HGB 12.3 10.8*  HCT 36.6 32.4*    Assessment/Plan: Plan for discharge tomorrow, Lactation consult, and Contraception nexplanon tomorrow   LOS: 1 day   Jacklyn Shell 03/23/2023, 11:33 AM

## 2023-03-23 NOTE — Anesthesia Postprocedure Evaluation (Signed)
Anesthesia Post Note  Patient: Kathryn Luna  Procedure(s) Performed: AN AD HOC LABOR EPIDURAL     Patient location during evaluation: Mother Baby Anesthesia Type: Epidural Level of consciousness: awake and alert Pain management: pain level controlled Vital Signs Assessment: post-procedure vital signs reviewed and stable Respiratory status: spontaneous breathing, nonlabored ventilation and respiratory function stable Cardiovascular status: stable Postop Assessment: no headache, no backache and epidural receding Anesthetic complications: no   No notable events documented.  Last Vitals:  Vitals:   03/23/23 0125 03/23/23 0235  BP: 114/65 (!) 111/58  Pulse: 72 82  Resp: 16 16  Temp: 37 C 37.1 C  SpO2: 99% 98%    Last Pain:  Vitals:   03/23/23 0235  TempSrc: Oral  PainSc:    Pain Goal:                Epidural/Spinal Function Cutaneous sensation: Normal sensation (03/23/23 0235), Patient able to flex knees: Yes (03/23/23 0235), Patient able to lift hips off bed: Yes (03/23/23 0235), Back pain beyond tenderness at insertion site: No (03/23/23 0235), Progressively worsening motor and/or sensory loss: No (03/23/23 0235), Bowel and/or bladder incontinence post epidural: No (03/23/23 0235)  Sirenia Whitis

## 2023-03-23 NOTE — Lactation Note (Signed)
This note was copied from a baby's chart. Lactation Consultation Note  Patient Name: Kathryn Luna [id number] Date: 03/23/2023 Age:20 hours Reason for consult: Initial assessment;Primapara;1st time breastfeeding;Term (per dad baby recently fed around 12n and according to the doc flow sheets baby took 19 ml. LC reviewed supply and demand, and recommended with feeding cues to call for latch assessment on the nurseslight) Baby has not been to the breast in his life.  Formula x 3 13-19 ml   Maternal Data    Feeding Mother's Current Feeding Choice: Breast Milk and Formula Nipple Type: Slow - flow  LATCH Score - none   Lactation Tools Discussed/Used    Interventions Interventions: Breast feeding basics reviewed;Education;LC Services brochure  Discharge Pump:  Kathryn Luna is not accepted by Kathryn Luna pump program . LC will provide the [facility] number for pumps and Hand pump prior to D/C) WIC Program: No (per mom)  Consult Status Consult Status: Follow-up Date: 03/23/23 Follow-up type: In-patient    Kathryn Luna 03/23/2023, 12:54 PM

## 2023-03-23 NOTE — Progress Notes (Signed)
CSW acknowledges consult for patient being 22 year old MOB and tobacco use. CSW is screening this referral out due to patient being over the age of 64 and there being no other psychosocial stressors listed in the chart. Per chart review, MOB quit smoking marijuana upon positive pregnancy test. No current/prior tobacco use noted in chart review. Please contact CSW upon MOB request if needed.  Signed,  Norberto Sorenson, MSW, LCSWA, LCASA 03/23/2023 9:42 AM

## 2023-03-24 ENCOUNTER — Inpatient Hospital Stay (HOSPITAL_COMMUNITY): Admission: RE | Admit: 2023-03-24 | Payer: Medicaid Other | Source: Home / Self Care | Admitting: Family Medicine

## 2023-03-24 ENCOUNTER — Inpatient Hospital Stay (HOSPITAL_COMMUNITY): Payer: Medicaid Other

## 2023-03-24 ENCOUNTER — Encounter (HOSPITAL_COMMUNITY): Payer: Self-pay | Admitting: Obstetrics and Gynecology

## 2023-03-24 DIAGNOSIS — Z30017 Encounter for initial prescription of implantable subdermal contraceptive: Secondary | ICD-10-CM | POA: Diagnosis not present

## 2023-03-24 DIAGNOSIS — Z975 Presence of (intrauterine) contraceptive device: Secondary | ICD-10-CM

## 2023-03-24 HISTORY — PX: NEXPLANON TRAY: NUR84248

## 2023-03-24 MED ORDER — IBUPROFEN 600 MG PO TABS: 600.0000 mg | ORAL_TABLET | Freq: Four times a day (QID) | ORAL | 0 refills | Status: DC

## 2023-03-24 MED ORDER — ACETAMINOPHEN 325 MG PO TABS: 650.0000 mg | ORAL_TABLET | ORAL | 0 refills | Status: AC | PRN

## 2023-03-24 NOTE — Progress Notes (Addendum)
POSTPARTUM PROGRESS NOTE  Subjective: Kathryn Luna is a 22 y.o. G2P1011 s/p SVD at [redacted]w[redacted]d.  She reports she doing well. No acute events overnight. She denies any problems with ambulating, voiding or po intake. Denies nausea or vomiting.  Pain is well controlled. Vaginal bleeding has improved. Pt is ready to go home.   Objective: Blood pressure (!) 93/50, pulse 72, temperature 97.8 F (36.6 C), temperature source Oral, resp. rate 16, height 5\' 2"  (1.575 m), weight 78.5 kg, SpO2 100 %, unknown if currently breastfeeding.  Physical Exam:  General: alert, cooperative and no distress Chest: no respiratory distress Abdomen: soft, non-tender  Uterine Fundus: firm and at level of umbilicus Extremities: No calf swelling or tenderness  no BLE edema  Recent Labs    03/22/23 1847 03/23/23 0519  HGB 12.3 10.8*  HCT 36.6 32.4*    Assessment/Plan: Kathryn Luna is a 22 y.o. G2P1011 s/p SVD at [redacted]w[redacted]d.  Routine Postpartum Care: Doing well, pain well-controlled.  -- Continue routine care, lactation support  -- Contraception: Pt wants nexplanon placed today prior to discharge -- Feeding: breast  Dispo: Plan for discharge today.  Para March, DO Faculty Practice, Center for [employer] 03/24/2023 6:22 AM

## 2023-03-24 NOTE — Procedures (Signed)
Post-Placental Nexplanon Insertion Procedure Note  Patient was identified. Informed consent was signed, signed copy in chart. A time-out was performed.    The insertion site was identified 8-10 cm (3-4 inches) from the medial epicondyle of the humerus and 3-5 cm (1.25-2 inches) posterior to (below) the sulcus (groove) between the biceps and triceps muscles of the patient's left arm and marked. The site was prepped and draped in the usual sterile fashion. Pt was prepped with alcohol swab and then injected with 3 cc of 1% lidocaine. The site was prepped with betadine. Nexplanon removed form packaging,  Device confirmed in needle, then inserted full length of needle and withdrawn per handbook instructions. Provider and patient verified presence of the implant in the woman's arm by palpation. Pt insertion site was covered with steristrips/adhesive bandage and pressure bandage. There was minimal blood loss. Patient tolerated procedure well.  Patient was given post procedure instructions and Nexplanon user card with expiration date. Condoms were recommended for STI prevention. Patient was asked to keep the pressure dressing on for 24 hours to minimize bruising and keep the adhesive bandage on for 3-5 days. The patient verbalized understanding of the plan of care and agrees.   Lot # [id number] Expiration Date 2026/04   Myrtie Hawk, DO FMOB Fellow, Faculty practice [facility], Center for [facility] 03/24/23  1:42 PM

## 2023-04-16 ENCOUNTER — Telehealth (HOSPITAL_COMMUNITY): Payer: Self-pay | Admitting: *Deleted

## 2023-04-16 NOTE — Telephone Encounter (Signed)
04/16/2023  Name: Kathryn Luna MRN: [id number] DOB: Jan 30, 2001  Reason for Call:  Transition of Care Hospital Discharge Call  Contact Status: Patient Contact Status: Message  Language assistant needed: Interpreter Mode: Interpreter Not Needed        Follow-Up Questions:    Inocente Salles Postnatal Depression Scale:  In the Past 7 Days:    PHQ2-9 Depression Scale:     Discharge Follow-up:    Post-discharge interventions: NA  Salena Saner, RN 04/16/2023 13:14

## 2023-04-18 DIAGNOSIS — Z419 Encounter for procedure for purposes other than remedying health state, unspecified: Secondary | ICD-10-CM | POA: Diagnosis not present

## 2023-05-06 ENCOUNTER — Ambulatory Visit (INDEPENDENT_AMBULATORY_CARE_PROVIDER_SITE_OTHER): Payer: Medicaid Other | Admitting: Student

## 2023-05-06 ENCOUNTER — Encounter: Payer: Self-pay | Admitting: Student

## 2023-05-06 VITALS — BP 100/60 | HR 74 | Ht 62.0 in | Wt 152.0 lb

## 2023-05-06 DIAGNOSIS — D509 Iron deficiency anemia, unspecified: Secondary | ICD-10-CM | POA: Insufficient documentation

## 2023-05-06 DIAGNOSIS — D649 Anemia, unspecified: Secondary | ICD-10-CM | POA: Diagnosis not present

## 2023-05-06 DIAGNOSIS — N939 Abnormal uterine and vaginal bleeding, unspecified: Secondary | ICD-10-CM | POA: Insufficient documentation

## 2023-05-06 DIAGNOSIS — D508 Other iron deficiency anemias: Secondary | ICD-10-CM

## 2023-05-06 LAB — POCT HEMOGLOBIN: Hemoglobin: 10.8 g/dL — AB (ref 11–14.6)

## 2023-05-06 MED ORDER — NORGESTIMATE-ETH ESTRADIOL 0.25-35 MG-MCG PO TABS: 1.0000 | ORAL_TABLET | Freq: Every day | ORAL | 0 refills | Status: DC

## 2023-05-06 NOTE — Assessment & Plan Note (Signed)
Likely lochia after SVD in addition to bleeding changes after initiation of Nexplanon. Reassuringly, not having any major bleeding concerning for trauma or injury. Plan to treat with 1 month of  monophasic oral contraceptive will help regulate cycle (of note, she has no contraindications including migraine with aura, history of VTE or DVT).  If no improvement or resolution, she is to return to clinic for further evaluation.

## 2023-05-06 NOTE — Progress Notes (Signed)
    SUBJECTIVE:   CHIEF COMPLAINT / HPI:   Kathryn Luna is a pleasant 22 year old female here for postpartum follow-up.  Postpartum visit: patient is 6 weeks postpartum following a vaginal delivery. -Breastfeeding: No- bottle feeding -Contraception: Nexplanon, placed while in hospital after delivery. -Bleeding: She is having spotting every day (not like a full period) but she is wearing many pads and has to change them 3 times a day.  She finds this very bothersome. -Bonding with infant: Yes bonding well -Sexual activity: Not yet -Mood: Doing well-having some postpartum blues, but otherwise denies excessive crying or depression.   OBJECTIVE:   BP 100/60   Pulse 74   Ht 5\' 2"  (1.575 m)   Wt 152 lb (68.9 kg)   LMP  (LMP Unknown) Comment: still bleeding  SpO2 97%   Breastfeeding No   BMI 27.80 kg/m   General: Pleasant, well-appearing, well-groomed HEENT: Pupils PERRLA.  No periorbital edema CV: Regular rate and rhythm Respiratory: No increased work of breathing on room air.  Lungs are clear throughout. Extremities: No peripheral edema.  ASSESSMENT/PLAN:   Vaginal spotting Likely lochia after SVD in addition to bleeding changes after initiation of Nexplanon. Reassuringly, not having any major bleeding concerning for trauma or injury. Plan to treat with 1 month of  monophasic oral contraceptive will help regulate cycle (of note, she has no contraindications including migraine with aura, history of VTE or DVT).  If no improvement or resolution, she is to return to clinic for further evaluation.  Iron deficiency anemia POC hemoglobin today 10.8.  Per chart review, baseline is between 10 -12. Continue ferrous sulfate daily.  Likely acutely worsened by chronic blood loss with dysfunctional uterine bleeding which will be treated with OCPs per plan above. No concerning red flag signs or symptoms including major blood loss, dizziness, weakness, shortness of breath. Repeat  hemoglobin at next visit.     Darral Dash, DO [facility]

## 2023-05-06 NOTE — Patient Instructions (Addendum)
It was great seeing you today.  As we discussed, -Start taking Sprintec (norgestimate ethinyl estradiol) 1 tablet daily.  The estrogen helps with regulating the lining of the uterus you do not have as much spotting/bleeding.  For a month.  If your bleeding is not resolved at that time, please come back and see [place].  Otherwise, we will see you in 6 months.   If you have any questions or concerns, please feel free to call the clinic.   Have a wonderful day,  Dr. Darral Dash [facility] [phone]

## 2023-05-06 NOTE — Assessment & Plan Note (Signed)
POC hemoglobin today 10.8.  Per chart review, baseline is between 10 -12. Continue ferrous sulfate daily.  Likely acutely worsened by chronic blood loss with dysfunctional uterine bleeding which will be treated with OCPs per plan above. No concerning red flag signs or symptoms including major blood loss, dizziness, weakness, shortness of breath. Repeat hemoglobin at next visit.

## 2023-05-19 DIAGNOSIS — Z419 Encounter for procedure for purposes other than remedying health state, unspecified: Secondary | ICD-10-CM | POA: Diagnosis not present

## 2023-05-26 ENCOUNTER — Other Ambulatory Visit: Payer: Self-pay | Admitting: Student

## 2023-06-18 DIAGNOSIS — Z419 Encounter for procedure for purposes other than remedying health state, unspecified: Secondary | ICD-10-CM | POA: Diagnosis not present

## 2023-07-19 DIAGNOSIS — Z419 Encounter for procedure for purposes other than remedying health state, unspecified: Secondary | ICD-10-CM | POA: Diagnosis not present

## 2023-08-18 DIAGNOSIS — Z419 Encounter for procedure for purposes other than remedying health state, unspecified: Secondary | ICD-10-CM | POA: Diagnosis not present

## 2023-09-18 DIAGNOSIS — Z419 Encounter for procedure for purposes other than remedying health state, unspecified: Secondary | ICD-10-CM | POA: Diagnosis not present

## 2023-09-23 ENCOUNTER — Ambulatory Visit: Payer: Medicaid Other | Admitting: Student

## 2023-09-23 ENCOUNTER — Encounter: Payer: Self-pay | Admitting: Student

## 2023-09-23 VITALS — BP 96/53 | HR 60 | Ht 62.0 in | Wt 147.4 lb

## 2023-09-23 DIAGNOSIS — N939 Abnormal uterine and vaginal bleeding, unspecified: Secondary | ICD-10-CM | POA: Diagnosis not present

## 2023-09-23 DIAGNOSIS — Z3490 Encounter for supervision of normal pregnancy, unspecified, unspecified trimester: Secondary | ICD-10-CM | POA: Diagnosis not present

## 2023-09-23 DIAGNOSIS — R5383 Other fatigue: Secondary | ICD-10-CM

## 2023-09-23 DIAGNOSIS — H5213 Myopia, bilateral: Secondary | ICD-10-CM | POA: Diagnosis not present

## 2023-09-23 LAB — POCT HEMOGLOBIN: Hemoglobin: 10.7 g/dL — AB (ref 11–14.6)

## 2023-09-23 MED ORDER — FERROUS SULFATE 325 (65 FE) MG PO TABS
325.0000 mg | ORAL_TABLET | ORAL | 3 refills | Status: AC
Start: 2023-09-23 — End: ?

## 2023-09-23 MED ORDER — NORGESTIMATE-ETH ESTRADIOL 0.25-35 MG-MCG PO TABS: 1.0000 | ORAL_TABLET | Freq: Every day | ORAL | 0 refills | Status: DC

## 2023-09-23 NOTE — Patient Instructions (Addendum)
 It was great to see you! Thank you for allowing me to participate in your care!   Our plans for today:  - I am restarting iron pill and doing 1 month of birth control pills - consider taking nexplanon  out and trying a different birth control method -hgb is 10.7 so slightly anemic but not crazy low - I am placing labs for you  Take care and seek immediate care sooner if you develop any concerns.  Wendel Lesch, MD

## 2023-09-23 NOTE — Progress Notes (Signed)
    SUBJECTIVE:   CHIEF COMPLAINT / HPI: Tired   Interpreter present throughout entire encounter  Gave birth 03/2023, presents with continuous vaginal bleeding and fatigue. She reports feeling short of breath and extremely tired. Denies any lightheadedness. The patient was previously on iron pills and birth control pills for 1 month in August 2024 to manage the bleeding, which she reports helped to some extent. However, the bleeding resumed after she finished the course of pills. The patient has had a Nexplanon  implant placed 03/24/2023 after birth, which she suspects might be contributing to her symptoms. The patient has been experiencing these symptoms since September, and she reports needing to use three pads a day due to the bleeding.  PERTINENT  PMH / PSH: nexplanon  in place  OBJECTIVE:   BP (!) 96/53   Pulse 60   Ht 5' 2 (1.575 m)   Wt 147 lb 6.4 oz (66.9 kg)   SpO2 100%   BMI 26.96 kg/m   General: Well appearing, NAD, awake, alert, responsive to questions Head: Normocephalic atraumatic Respiratory: chest rises symmetrically,  no increased work of breathing on RA Abdomen: Soft, non-tender, non-distended Extremities: Moves upper and lower extremities freely  ASSESSMENT/PLAN:   Assessment & Plan Abnormal uterine bleeding Point-of-care hemoglobin 10.7 today.  Discussed with patient that this could be related to her Nexplanon  given it has been in place for over 6 months. - CBC; Future - TSH Rfx on Abnormal to Free T4; Future - Ferritin; Future - norgestimate -ethinyl estradiol  (ORTHO-CYCLEN) 0.25-35 MG-MCG tablet x 1 month - ferrous sulfate  325 (65 FE) MG tablet - Consider removal of nexplanon  and different BC method (she states not interested in IUD, or only pills - will think about it) - F/u with PCP scheduled 2/13 and lab visit 1/9    Wendel Lesch, MD [facility]

## 2023-09-26 ENCOUNTER — Other Ambulatory Visit: Payer: Medicaid Other

## 2023-09-26 DIAGNOSIS — N939 Abnormal uterine and vaginal bleeding, unspecified: Secondary | ICD-10-CM | POA: Diagnosis not present

## 2023-09-27 ENCOUNTER — Telehealth: Payer: Self-pay | Admitting: Student

## 2023-09-27 ENCOUNTER — Encounter: Payer: Self-pay | Admitting: Student

## 2023-09-27 LAB — CBC
Hematocrit: 35.8 % (ref 34.0–46.6)
Hemoglobin: 11.1 g/dL (ref 11.1–15.9)
MCH: 24.9 pg — ABNORMAL LOW (ref 26.6–33.0)
MCHC: 31 g/dL — ABNORMAL LOW (ref 31.5–35.7)
MCV: 80 fL (ref 79–97)
Platelets: 321 10*3/uL (ref 150–450)
RBC: 4.46 x10E6/uL (ref 3.77–5.28)
RDW: 17.8 % — ABNORMAL HIGH (ref 11.7–15.4)
WBC: 4.3 10*3/uL (ref 3.4–10.8)

## 2023-09-27 LAB — TSH RFX ON ABNORMAL TO FREE T4: TSH: 0.662 u[IU]/mL (ref 0.450–4.500)

## 2023-09-27 LAB — FERRITIN: Ferritin: 7 ng/mL — ABNORMAL LOW (ref 15–150)

## 2023-09-27 NOTE — Telephone Encounter (Signed)
 Called patient using interpreter services. Not set up for her number or mom's number. Will send letter about low ferritin, recommend repeat in 4 weeks after supplementation.

## 2023-10-19 DIAGNOSIS — Z419 Encounter for procedure for purposes other than remedying health state, unspecified: Secondary | ICD-10-CM | POA: Diagnosis not present

## 2023-10-31 ENCOUNTER — Ambulatory Visit: Payer: Self-pay | Admitting: Student

## 2023-11-01 ENCOUNTER — Ambulatory Visit (INDEPENDENT_AMBULATORY_CARE_PROVIDER_SITE_OTHER): Payer: Medicaid Other | Admitting: Family Medicine

## 2023-11-01 VITALS — BP 102/64 | HR 96 | Ht 62.0 in | Wt 143.4 lb

## 2023-11-01 DIAGNOSIS — D509 Iron deficiency anemia, unspecified: Secondary | ICD-10-CM | POA: Diagnosis not present

## 2023-11-01 DIAGNOSIS — N939 Abnormal uterine and vaginal bleeding, unspecified: Secondary | ICD-10-CM

## 2023-11-01 NOTE — Assessment & Plan Note (Signed)
Hemoglobin 10.7-11.1 with ferritin of 7 in January.  No signs of symptomatic anemia at this time.  Has been taking ferrous sulfate 325 every other day. -CBC, ferritin today -Continue ferrous sulfate as prescribed -Discussed supportive measures for constipation as needed

## 2023-11-01 NOTE — Progress Notes (Signed)
    SUBJECTIVE:   CHIEF COMPLAINT / HPI:   Vaginal bleeding/spotting: Patient following up today on vaginal bleeding symptoms.  Had uncomplicated SVD in July, with postdelivery placement of Nexplanon.  For 4 months after delivery, patient had continuous vaginal bleeding.  She was seen in clinic and found to have mild anemia and low ferritin.  She was started on iron supplements and monophasic oral contrceptive to regulate cycle.  She last completed Cyclen around the end of December with spotting around that time.  Then had no bleeding symptoms until spotting started again last Tuesday.  The bleeding is less than a typical period amount, reports using 2 pads a day.  Reports bleeding overall has been much better than it was in the time soon after delivery.  No dizziness or lightheadedness.  No excessive fatigue.  No blood noted in the stool or urine.  Patient prefers to keep Nexplanon.  PERTINENT  PMH / PSH: IDA, Heavy vaginal bleeding post-delivery  OBJECTIVE:   BP 102/64   Pulse 96   Ht 5\' 2"  (1.575 m)   Wt 143 lb 6.4 oz (65 kg)   SpO2 99%   BMI 26.23 kg/m   General: Well-appearing. Resting comfortably in room. CV: Normal S1/S2. No extra heart sounds. Warm and well-perfused. Pulm: Breathing comfortably on room air. CTAB. No increased WOB. Abd: Soft, non-tender, non-distended. Skin:  Warm, dry. Psych: Pleasant and appropriate.   ASSESSMENT/PLAN:   Assessment & Plan Iron deficiency anemia, unspecified iron deficiency anemia type Hemoglobin 10.7-11.1 with ferritin of 7 in January.  No signs of symptomatic anemia at this time.  Has been taking ferrous sulfate 325 every other day. -CBC, ferritin today -Continue ferrous sulfate as prescribed -Discussed supportive measures for constipation as needed Vaginal spotting Overall bleeding symptoms have improved.  Suspect bleeding symptoms secondary to lochia versus Nexplanon placement after delivery.  Last completed OC towards end of December.   -CTM bleeding symptoms -Fu CBC, ferritin per above  -No further OC at this time  Return to clinic in 1-2 months for continued follow-up.  Return sooner if heavy bleeding returns.  [name], MD [facility]

## 2023-11-01 NOTE — Patient Instructions (Addendum)
Thank you for visiting clinic today and allowing [place] to participate in your care!  We are so glad to hear that your bleeding symptoms have been improving. We will check a few labs today and let you know the results. Please continue taking the iron supplements as prescribed. We encourage you to stay hydrated and eat some high-fiber foods to help any constipation symptoms.   Please schedule an appointment in 1-2 months to check in on your symptoms. Please schedule sooner if you begin bleeding heavily again.   Reach out any time with any questions or concerns you may have - we are here for you!  [name], MD [facility] [phone]

## 2023-11-01 NOTE — Assessment & Plan Note (Signed)
Overall bleeding symptoms have improved.  Suspect bleeding symptoms secondary to lochia versus Nexplanon placement after delivery.  Last completed OC towards end of December.  -CTM bleeding symptoms -Fu CBC, ferritin per above  -No further OC at this time

## 2023-11-02 LAB — CBC
Hematocrit: 36.4 % (ref 34.0–46.6)
Hemoglobin: 11.5 g/dL (ref 11.1–15.9)
MCH: 26.2 pg — ABNORMAL LOW (ref 26.6–33.0)
MCHC: 31.6 g/dL (ref 31.5–35.7)
MCV: 83 fL (ref 79–97)
Platelets: 318 10*3/uL (ref 150–450)
RBC: 4.39 x10E6/uL (ref 3.77–5.28)
RDW: 16.2 % — ABNORMAL HIGH (ref 11.7–15.4)
WBC: 5.4 10*3/uL (ref 3.4–10.8)

## 2023-11-02 LAB — FERRITIN: Ferritin: 8 ng/mL — ABNORMAL LOW (ref 15–150)

## 2023-11-04 ENCOUNTER — Encounter: Payer: Self-pay | Admitting: Family Medicine

## 2023-11-04 ENCOUNTER — Telehealth: Payer: Self-pay | Admitting: Family Medicine

## 2023-11-04 NOTE — Telephone Encounter (Signed)
 Attempted to call patient regarding recent results. Unable to reach patient. Left voicemail about sending her a letter. Advised to call our clinic with any questions or concerns.

## 2023-11-04 NOTE — Progress Notes (Deleted)
 Attempted to call patient regarding recent results. Unable to reach patient. Left voicemail about sending her a letter. Advised to call our clinic with any questions or concerns.

## 2023-11-16 DIAGNOSIS — Z419 Encounter for procedure for purposes other than remedying health state, unspecified: Secondary | ICD-10-CM | POA: Diagnosis not present

## 2023-11-22 ENCOUNTER — Encounter: Payer: Self-pay | Admitting: Student

## 2023-11-22 ENCOUNTER — Ambulatory Visit: Admitting: Student

## 2023-11-22 VITALS — BP 113/61 | HR 79 | Ht 62.0 in | Wt 142.8 lb

## 2023-11-22 DIAGNOSIS — M545 Low back pain, unspecified: Secondary | ICD-10-CM

## 2023-11-22 LAB — POCT URINALYSIS DIP (MANUAL ENTRY)
Bilirubin, UA: NEGATIVE
Blood, UA: NEGATIVE
Glucose, UA: NEGATIVE mg/dL
Ketones, POC UA: NEGATIVE mg/dL
Leukocytes, UA: NEGATIVE
Nitrite, UA: NEGATIVE
Protein Ur, POC: NEGATIVE mg/dL
Spec Grav, UA: 1.015 (ref 1.010–1.025)
Urobilinogen, UA: 0.2 U/dL
pH, UA: 6 (ref 5.0–8.0)

## 2023-11-22 MED ORDER — MELOXICAM 7.5 MG PO TABS: 7.5000 mg | ORAL_TABLET | Freq: Every day | ORAL | 0 refills | Status: AC

## 2023-11-22 NOTE — Patient Instructions (Addendum)
 Kathryn Luna,  It is great to meet you, your urine looks fine, no evidence of infection or kidney stones.  I am going to send in some meloxicam to your pharmacy to help calm down any muscle inflammation. I expect you'll be feeling better within two weeks. If you are not, please come back to see [place].  Be sure that you continue taking your iron supplement every other day and plan to come back in a month or so to repeat that testing.   Eliezer Mccoy, MD

## 2023-11-22 NOTE — Progress Notes (Signed)
    SUBJECTIVE:   CHIEF COMPLAINT / HPI:   Low Back Pain Having right-sided low back pain for the past 2 days.  She comes in concerned because her mother had a similar presentation and was told that she had kidney stones.  She denies any new activities or injuries to the area.  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  She does not have any fevers, dysuria, urinary frequency, urgency.    OBJECTIVE:   BP 113/61   Pulse 79   Ht 5\' 2"  (1.575 m)   Wt 142 lb 12.8 oz (64.8 kg)   SpO2 100%   BMI 26.12 kg/m   Gen: Well-appearing and NAD Pulm: Normal WOB on RA, speaking in full sentences Abd: Non-distended. Without suprapubic tenderness or CVA tenderness  MSK: There is no obvious deformity or midline tenderness. There is tenderness over the iliac crest. Pain is reproduced on resisted back extension. The SI joint is non-tender. Neg SLR. Neg log roll. Neg FABER/FADIR.    ASSESSMENT/PLAN:   Assessment & Plan Acute low back pain, unspecified back pain laterality, unspecified whether sciatica present UA non-infectious and without blood. Nexplanon in place, so not concerned for pregnancy. No CVA tenderness or symptoms otherwise to suggest underlying renal pathology. Suspect primary MSK etiology, possibly involving muscular insertion along the superior iliac crest.  - Conservative management reviewed - Meloxicam 7.5mg  daily     J Dorothyann Gibbs, MD [facility]

## 2023-12-28 DIAGNOSIS — Z419 Encounter for procedure for purposes other than remedying health state, unspecified: Secondary | ICD-10-CM | POA: Diagnosis not present

## 2024-01-27 DIAGNOSIS — Z419 Encounter for procedure for purposes other than remedying health state, unspecified: Secondary | ICD-10-CM | POA: Diagnosis not present

## 2024-02-27 DIAGNOSIS — Z419 Encounter for procedure for purposes other than remedying health state, unspecified: Secondary | ICD-10-CM | POA: Diagnosis not present

## 2024-03-25 ENCOUNTER — Encounter: Payer: Self-pay | Admitting: Family Medicine

## 2024-03-25 ENCOUNTER — Ambulatory Visit: Admitting: Family Medicine

## 2024-03-25 VITALS — BP 110/53 | HR 80 | Ht 62.0 in | Wt 137.0 lb

## 2024-03-25 DIAGNOSIS — N939 Abnormal uterine and vaginal bleeding, unspecified: Secondary | ICD-10-CM | POA: Diagnosis not present

## 2024-03-25 MED ORDER — NORGESTIMATE-ETH ESTRADIOL 0.25-35 MG-MCG PO TABS: 1.0000 | ORAL_TABLET | Freq: Every day | ORAL | 0 refills | Status: AC

## 2024-03-25 NOTE — Patient Instructions (Signed)
 Good to see you today - Thank you for coming in  Things we discussed today:  1) We will give a 1 month course of Ortho-Cyclen to help control your bleeding  2) It is most likely that your bleeding is from your nexplanon . Consider switching to another form of contraception such as the mirena IUD. This IUD could also help to control your bleeding.  3) Continue taking your iron supplement daily. Since you are still bleeding so much, you are losing a lot of iron.  4) If your bleeding is not slowing dowin in 2 weeks, come back to see us . Otherwise, follow-up in 1 month.

## 2024-03-25 NOTE — Progress Notes (Signed)
    SUBJECTIVE:   CHIEF COMPLAINT / HPI:   AT is a 23yo F that pf heavy menstrual bleeding.  - Has been having irregular periods since having her baby and getting her nexplanon  placed - She has had OCP's in the past to help control bleeding. She switched because it was easier to not have to take a pill every day. - Has been having bleeding for past 1.5 weeks. Last period was 3 months ago, and lasted 1 month.   - Took OCP for 1 month around December of last year and it did control her bleeding.  - Her friend also had irregular bleeding with Nexplanon  and switched to Mirena IUD which improved her bleeding.  Patient is curious about Mirena IUDs or other options. - She is no longer taking her iron supplements.   PERTINENT  PMH / PSH: IDA, menorrhagia  OBJECTIVE:   BP (!) 110/53   Pulse 80   Ht 5' 2 (1.575 m)   Wt 137 lb (62.1 kg)   SpO2 100%   BMI 25.06 kg/m   General: Alert, pleasant woman. NAD. HEENT: NCAT. MMM. CV: RRR, no murmurs.   Resp: CTAB, no wheezing or crackles. Normal WOB on RA.  Abm: Soft, nontender, nondistended. BS present. Ext: Moves all ext spontaneously Skin: Warm, well perfused   ASSESSMENT/PLAN:   Assessment & Plan Abnormal uterine bleeding Leading differential is nexplanon  side-effects given onset of symptoms after nexplanon  placement. Was previously controlled with a 1 month course of Ortho-Cyclen OCP; plan to give another 1 month supply to help with current episode of bleeding.  Recommended removal of Nexplanon  and switching to a different contraception method; patient is considering Mirena IUD. - 1 month of Ortho-Cyclen daily to control current episode of bleeding - If bleeding is not improving in 1 to 2 weeks, return to clinic - Advised to follow-up to discuss switching contraception options - CBC today to assess anemia. Recommended restart iron supplement given current active bleeding.     Kathryn Nearing, MD [facility]

## 2024-03-26 ENCOUNTER — Ambulatory Visit: Payer: Self-pay | Admitting: Family Medicine

## 2024-03-26 LAB — CBC
Hematocrit: 38.2 % (ref 34.0–46.6)
Hemoglobin: 11.7 g/dL (ref 11.1–15.9)
MCH: 26.8 pg (ref 26.6–33.0)
MCHC: 30.6 g/dL — ABNORMAL LOW (ref 31.5–35.7)
MCV: 88 fL (ref 79–97)
Platelets: 297 x10E3/uL (ref 150–450)
RBC: 4.36 x10E6/uL (ref 3.77–5.28)
RDW: 14.5 % (ref 11.7–15.4)
WBC: 4 x10E3/uL (ref 3.4–10.8)

## 2024-03-28 DIAGNOSIS — Z419 Encounter for procedure for purposes other than remedying health state, unspecified: Secondary | ICD-10-CM | POA: Diagnosis not present

## 2024-04-28 DIAGNOSIS — Z419 Encounter for procedure for purposes other than remedying health state, unspecified: Secondary | ICD-10-CM | POA: Diagnosis not present

## 2024-05-29 DIAGNOSIS — Z419 Encounter for procedure for purposes other than remedying health state, unspecified: Secondary | ICD-10-CM | POA: Diagnosis not present

## 2024-06-02 ENCOUNTER — Ambulatory Visit

## 2024-06-02 NOTE — Progress Notes (Deleted)
    SUBJECTIVE:   CHIEF COMPLAINT / HPI:   Sore throat, runny nose Started ***  PERTINENT  PMH / PSH: Reviewed.  OBJECTIVE:   There were no vitals taken for this visit.  General: ***-appearing, no acute distress. HEENT: normocephalic, PERRLA, EOM grossly intact, MMM, bilateral TM visualized without erythema or bulging. Cardio: Regular rate, *** rhythm, no murmurs on exam. Pulm: Clear, no wheezing, no crackles. No increased work of breathing. Abdominal: bowel sounds present, soft, non-tender, non-distended. No HSM. Extremities: no peripheral edema. Moves all extremities equally. Neuro: Alert and oriented x3, speech normal in content, no facial asymmetry, strength intact and equal bilaterally in UE and LE, pupils equal and reactive to light.  Psych:  Cognition and judgment appear intact. Alert, communicative, and cooperative with normal attention span and concentration. No apparent delusions, illusions, hallucinations    ASSESSMENT/PLAN:   Assessment & Plan      Lauraine Norse, DO [facility]

## 2024-06-03 ENCOUNTER — Ambulatory Visit: Admitting: Family Medicine

## 2024-06-03 ENCOUNTER — Encounter: Payer: Self-pay | Admitting: Family Medicine

## 2024-06-03 VITALS — BP 113/66 | HR 81 | Ht 62.0 in | Wt 141.4 lb

## 2024-06-03 DIAGNOSIS — U071 COVID-19: Secondary | ICD-10-CM | POA: Diagnosis not present

## 2024-06-03 LAB — POC SOFIA 2 FLU + SARS ANTIGEN FIA
Influenza A, POC: NEGATIVE
Influenza B, POC: NEGATIVE
SARS Coronavirus 2 Ag: POSITIVE — AB

## 2024-06-03 NOTE — Patient Instructions (Signed)
 Thank you for coming in today! Here is a summary of what we discussed:  -You tested positive for COVID. You should quarantine for 5 days after the start of symptoms. You can use honey or warm fluids to help with sore throat. Otherwise, take Tylenol /Ibuprofen  and try to stay hydrated. Let me know if things aren't getting better in the next 5-7 days or if your symptoms become more severe  -Same recommendations for [place]!  Please call the clinic at [phone] if your symptoms worsen or you have any concerns.  Best, Dr Adele

## 2024-06-03 NOTE — Progress Notes (Signed)
    SUBJECTIVE:   CHIEF COMPLAINT / HPI:   Congestion, sore throat, runny nose Symptoms for 2 days, mom had COVID first Son has sx too, had a fever over 100 Patient has been eating and drinking well, has been trying hot tea with honey No GI upset Some ear pressure but no drainage  PERTINENT  PMH / PSH: None  OBJECTIVE:   BP 113/66   Pulse 81   Ht 5' 2 (1.575 m)   Wt 141 lb 6.4 oz (64.1 kg)   SpO2 100%   BMI 25.86 kg/m   General: Awake and conversant, in no acute distress HEENT: Normal TM bilaterally, no ear drainage Respiratory: CTAB, normal work of breathing on room air Neuro: No focal deficits Psych: Appropriate mood and affect   ASSESSMENT/PLAN:   Assessment & Plan COVID-19 Patient tested positive for COVID.  Sounds like this has been going around her family.  Appears that she is staying hydrated, no concerns for severe infection.  Advised symptomatic management with Tylenol /ibuprofen  and warm fluids with honey.  Reviewed return precautions.  Also provided recommendations for patient's 21-year-old son (also my patient) who likely also has COVID.     Rea Raring, MD [facility]

## 2024-06-15 ENCOUNTER — Ambulatory Visit (INDEPENDENT_AMBULATORY_CARE_PROVIDER_SITE_OTHER)

## 2024-06-15 VITALS — BP 96/53 | HR 63 | Ht 63.0 in | Wt 140.4 lb

## 2024-06-15 DIAGNOSIS — Z23 Encounter for immunization: Secondary | ICD-10-CM

## 2024-06-15 DIAGNOSIS — U071 COVID-19: Secondary | ICD-10-CM

## 2024-06-15 LAB — POC SOFIA 2 FLU + SARS ANTIGEN FIA
Influenza A, POC: NEGATIVE
Influenza B, POC: NEGATIVE
SARS Coronavirus 2 Ag: NEGATIVE

## 2024-06-15 NOTE — Progress Notes (Signed)
    SUBJECTIVE:   CHIEF COMPLAINT / HPI:   She works for a [employer]. Diagnosed w/ covid via office swab on 9/17.   Her mom got covid initially, gave it to her 21 year old baby who then got her sick.   Symptoms lasted for 4 days, predominant symptom was discomfort and a sensation of water in her ear. She also had cough, sore throat, runny nose, fever. Family is doing better now but her work requested that she get a repeat covid test before returning to work.   PERTINENT  PMH / PSH: Recent covid infection  OBJECTIVE:   BP (!) 96/53   Pulse 63   Ht 5' 3 (1.6 m)   Wt 140 lb 6.4 oz (63.7 kg)   SpO2 100%   BMI 24.87 kg/m   General: Awake, alert, NAD. Communicates clearly. HEENT: NCAT. PERRLA EOMI. Anicteric sclera, conjunctiva clear. TM intact w/o effusion, EAC patent. MMM, clear OP w/ no exudates or erythema.  Cardio: RRR. Normal S1, S2. No murmur, rub, gallop. 2+ radial and dorsalis pedis pulses b/l w/ good capillary refill.  Resp: CTA bilaterally. No wheezes, rales, or rhonchi. Normal work of breathing on room air.    ASSESSMENT/PLAN:   Assessment & Plan COVID-19 Patient is now asymptomatic and appears to have completely recovered from COVID.  Her work requested a repeat COVID test before returning to patient care.  COVID swab in clinic today.  If swab happens to come back positive, high suspicion that it is due to noninfectious delayed viral shedding, and patient should be appropriate to return to work regardless. Encounter for immunization Flu shot administered in clinic today    Leafy Scriver, DO [facility]

## 2024-08-17 ENCOUNTER — Other Ambulatory Visit: Payer: Self-pay

## 2024-08-17 ENCOUNTER — Emergency Department (HOSPITAL_COMMUNITY)
Admission: EM | Admit: 2024-08-17 | Discharge: 2024-08-17 | Disposition: A | Discharge status: Home / Self Care | Attending: Emergency Medicine | Admitting: Emergency Medicine

## 2024-08-17 ENCOUNTER — Encounter (HOSPITAL_COMMUNITY): Payer: Self-pay

## 2024-08-17 ENCOUNTER — Emergency Department (HOSPITAL_COMMUNITY)

## 2024-08-17 DIAGNOSIS — Y92410 Unspecified street and highway as the place of occurrence of the external cause: Secondary | ICD-10-CM | POA: Insufficient documentation

## 2024-08-17 DIAGNOSIS — S01511A Laceration without foreign body of lip, initial encounter: Secondary | ICD-10-CM | POA: Insufficient documentation

## 2024-08-17 DIAGNOSIS — S0990XA Unspecified injury of head, initial encounter: Secondary | ICD-10-CM | POA: Diagnosis present

## 2024-08-17 DIAGNOSIS — S0292XA Unspecified fracture of facial bones, initial encounter for closed fracture: Secondary | ICD-10-CM | POA: Insufficient documentation

## 2024-08-17 DIAGNOSIS — M25521 Pain in right elbow: Secondary | ICD-10-CM | POA: Insufficient documentation

## 2024-08-17 LAB — PREGNANCY, URINE: Preg Test, Ur: NEGATIVE

## 2024-08-17 MED ORDER — LIDOCAINE HCL (PF) 1 % IJ SOLN
10.0000 mL | Freq: Once | INTRAMUSCULAR | Status: AC
  Administered 2024-08-17: 10 mL
  Filled 2024-08-17: qty 10

## 2024-08-17 MED ORDER — LIDOCAINE-EPINEPHRINE 1 %-1:100000 IJ SOLN
10.0000 mL | Freq: Once | INTRAMUSCULAR | Status: DC
  Filled 2024-08-17: qty 1
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
## 2024-08-17 NOTE — Discharge Instructions (Addendum)
 You have a small fracture of your anterior nasal spine. This will heal overtime. You may have your sutures removed in 5 days.

## 2024-08-17 NOTE — ED Notes (Signed)
 Patient transported to CT
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
## 2024-08-18 ENCOUNTER — Ambulatory Visit: Admitting: Family Medicine
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
